# Patient Record
Sex: Male | Born: 1945 | Race: Black or African American | Hispanic: No | Marital: Single | Smoking: Former smoker
Health system: Southern US, Community
[De-identification: ages and names within clinical notes are randomized; demographics above are authoritative.]

## PROBLEM LIST (undated history)

## (undated) DIAGNOSIS — I10 Essential (primary) hypertension: Secondary | ICD-10-CM

## (undated) DIAGNOSIS — I509 Heart failure, unspecified: Secondary | ICD-10-CM

## (undated) DIAGNOSIS — F32A Depression, unspecified: Secondary | ICD-10-CM

## (undated) DIAGNOSIS — J439 Emphysema, unspecified: Secondary | ICD-10-CM

## (undated) DIAGNOSIS — J939 Pneumothorax, unspecified: Secondary | ICD-10-CM

## (undated) DIAGNOSIS — IMO0002 Reserved for concepts with insufficient information to code with codable children: Secondary | ICD-10-CM

## (undated) DIAGNOSIS — C3490 Malignant neoplasm of unspecified part of unspecified bronchus or lung: Secondary | ICD-10-CM

## (undated) DIAGNOSIS — M1990 Unspecified osteoarthritis, unspecified site: Secondary | ICD-10-CM

## (undated) DIAGNOSIS — I7090 Unspecified atherosclerosis: Secondary | ICD-10-CM

## (undated) DIAGNOSIS — IMO0001 Reserved for inherently not codable concepts without codable children: Secondary | ICD-10-CM

## (undated) DIAGNOSIS — F329 Major depressive disorder, single episode, unspecified: Secondary | ICD-10-CM

## (undated) DIAGNOSIS — E785 Hyperlipidemia, unspecified: Secondary | ICD-10-CM

## (undated) HISTORY — DX: Reserved for inherently not codable concepts without codable children: IMO0001

## (undated) HISTORY — PX: LUMBAR LAMINECTOMY: SHX95

## (undated) HISTORY — PX: EXCISIONAL HEMORRHOIDECTOMY: SHX1541

## (undated) HISTORY — PX: COLON SURGERY: SHX602

## (undated) HISTORY — DX: Reserved for concepts with insufficient information to code with codable children: IMO0002

---

## 2001-03-01 ENCOUNTER — Encounter: Payer: Self-pay | Admitting: *Deleted

## 2001-03-01 ENCOUNTER — Emergency Department (HOSPITAL_COMMUNITY): Admission: EM | Admit: 2001-03-01 | Discharge: 2001-03-01 | Payer: Self-pay | Admitting: *Deleted

## 2012-11-16 ENCOUNTER — Institutional Professional Consult (permissible substitution): Payer: Self-pay | Admitting: Internal Medicine

## 2013-02-13 LAB — PULMONARY FUNCTION TEST

## 2013-03-17 DIAGNOSIS — J939 Pneumothorax, unspecified: Secondary | ICD-10-CM

## 2013-03-17 HISTORY — DX: Pneumothorax, unspecified: J93.9

## 2013-04-01 DIAGNOSIS — C3490 Malignant neoplasm of unspecified part of unspecified bronchus or lung: Secondary | ICD-10-CM

## 2013-04-01 HISTORY — DX: Malignant neoplasm of unspecified part of unspecified bronchus or lung: C34.90

## 2013-04-01 HISTORY — PX: LUNG BIOPSY: SHX232

## 2013-04-15 ENCOUNTER — Telehealth: Payer: Self-pay

## 2013-04-15 NOTE — Telephone Encounter (Signed)
Received a call from [employer] systems officer) that patient had questions regarding appointment scheduled for Thursday 04/18/2013.Called patient and informed him that today he will be seen in consultation and to allow about 1.5 hours.I will review allergies, medications and health history and then he will have 1 hour with Dr.Wentworth to discuss radiation, side effects of  treatment and  planning and any further testing that may be required.Told he may have breakfast.No treatment scheduled for this appointment.Patient very appreciative of return call and explanation.

## 2013-04-18 ENCOUNTER — Ambulatory Visit: Payer: Medicare HMO

## 2013-04-18 ENCOUNTER — Ambulatory Visit: Payer: Self-pay | Admitting: Radiation Oncology

## 2013-04-18 ENCOUNTER — Encounter: Payer: Self-pay | Admitting: Radiation Oncology

## 2013-04-18 DIAGNOSIS — C3490 Malignant neoplasm of unspecified part of unspecified bronchus or lung: Secondary | ICD-10-CM | POA: Insufficient documentation

## 2013-04-18 NOTE — Progress Notes (Signed)
Thoracic Location of Tumor / Histology: RUL lung  Patient presented less than 1 month ago with symptoms of: RUL nodule of undetermined nature  Biopsies of RUL nodule (if applicable) revealed:  6/38/46 lung, RUL, biopsy: positive for adenocarcinoma  Tobacco/Marijuana/Snuff/ETOH use: history of smoking 1.5- 2 PPD x 45 years, quit in 2012,  no smokeless tobacco, no alcohol currently but history of daily beer, quit w/diagnosis  Past/Anticipated interventions by cardiothoracic surgery, if any: surgery unlikely to be recommended due to co morbidities, pulmonary function, pt's age  Past/Anticipated interventions by medical oncology, if any: seen by Dr Hinton Rao  Signs/Symptoms  Weight changes, if any: no  Respiratory complaints, if any: productive cough w/clear sputum, SOB w/exertion, uses O2 @ 1.5-2 L/min nightly and prn during day, nebulizer treatments daily, inhalers daily  Hemoptysis, if any: no  Pain issues, if any:  no  SAFETY ISSUES:  Prior radiation? no  Pacemaker/ICD? no  Possible current pregnancy? na  Is the patient on methotrexate? no  Current Complaints / other details:  Single, retired from [employer], never married, no children

## 2013-04-19 ENCOUNTER — Encounter: Payer: Self-pay | Admitting: Radiation Oncology

## 2013-04-19 ENCOUNTER — Ambulatory Visit
Admission: RE | Admit: 2013-04-19 | Discharge: 2013-04-19 | Disposition: A | Discharge status: Home / Self Care | Payer: Medicare HMO | Source: Ambulatory Visit | Attending: Radiation Oncology | Admitting: Radiation Oncology

## 2013-04-19 VITALS — BP 141/82 | HR 83 | Temp 98.7°F | Resp 20 | Ht 69.5 in | Wt 149.3 lb

## 2013-04-19 DIAGNOSIS — IMO0002 Reserved for concepts with insufficient information to code with codable children: Secondary | ICD-10-CM | POA: Insufficient documentation

## 2013-04-19 DIAGNOSIS — C3490 Malignant neoplasm of unspecified part of unspecified bronchus or lung: Secondary | ICD-10-CM

## 2013-04-19 DIAGNOSIS — Z7982 Long term (current) use of aspirin: Secondary | ICD-10-CM | POA: Insufficient documentation

## 2013-04-19 DIAGNOSIS — Z510 Encounter for antineoplastic radiation therapy: Secondary | ICD-10-CM | POA: Insufficient documentation

## 2013-04-19 DIAGNOSIS — Z79899 Other long term (current) drug therapy: Secondary | ICD-10-CM | POA: Insufficient documentation

## 2013-04-19 DIAGNOSIS — F3289 Other specified depressive episodes: Secondary | ICD-10-CM | POA: Insufficient documentation

## 2013-04-19 DIAGNOSIS — Z85118 Personal history of other malignant neoplasm of bronchus and lung: Secondary | ICD-10-CM | POA: Insufficient documentation

## 2013-04-19 DIAGNOSIS — F329 Major depressive disorder, single episode, unspecified: Secondary | ICD-10-CM | POA: Insufficient documentation

## 2013-04-19 DIAGNOSIS — C3410 Malignant neoplasm of upper lobe, unspecified bronchus or lung: Secondary | ICD-10-CM | POA: Insufficient documentation

## 2013-04-19 DIAGNOSIS — J438 Other emphysema: Secondary | ICD-10-CM | POA: Insufficient documentation

## 2013-04-19 DIAGNOSIS — I10 Essential (primary) hypertension: Secondary | ICD-10-CM | POA: Insufficient documentation

## 2013-04-19 DIAGNOSIS — Z87891 Personal history of nicotine dependence: Secondary | ICD-10-CM | POA: Insufficient documentation

## 2013-04-19 DIAGNOSIS — E785 Hyperlipidemia, unspecified: Secondary | ICD-10-CM | POA: Insufficient documentation

## 2013-04-19 HISTORY — DX: Unspecified osteoarthritis, unspecified site: M19.90

## 2013-04-19 HISTORY — DX: Heart failure, unspecified: I50.9

## 2013-04-19 HISTORY — DX: Pneumothorax, unspecified: J93.9

## 2013-04-19 HISTORY — DX: Emphysema, unspecified: J43.9

## 2013-04-19 HISTORY — DX: Depression, unspecified: F32.A

## 2013-04-19 HISTORY — DX: Unspecified atherosclerosis: I70.90

## 2013-04-19 HISTORY — DX: Malignant neoplasm of unspecified part of unspecified bronchus or lung: C34.90

## 2013-04-19 HISTORY — DX: Essential (primary) hypertension: I10

## 2013-04-19 HISTORY — DX: Hyperlipidemia, unspecified: E78.5

## 2013-04-19 HISTORY — DX: Major depressive disorder, single episode, unspecified: F32.9

## 2013-04-19 NOTE — Progress Notes (Signed)
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 male  who was found to have a right upper lobe mass on a chest x-ray. A followup CT scan showed a 2 cm mass in the right upper lobe. A PET scan was performed on 03/18/2013 which showed a 2.1 x 2.3 cm right upper lobe nodule with an SUV of 9.3. He underwent biopsy of this mass on 04/01/2013 which showed adenocarcinoma. His biopsy was complicated by a pneumothorax for which he was hospitalized overnight. He is recovered well from that. He was sent to me for my opinion regarding stereotactic body radiation in the management of this newly diagnosed stage I lung cancer. He has been evaluated by pulmonology and is not felt to be a surgical candidate due to his lung function. He is asymptomatic. He denies any weight loss hemoptysis headaches or bone pain. He presented unaccompanied today.Marland Kitchen  PREVIOUS RADIATION THERAPY: No  PAST MEDICAL HISTORY:  has a past medical history of Lung cancer (04/01/13); Emphysema lung; Atherosclerosis; Hypertension; Hyperlipidemia; Depression; Osteoarthritis; Pneumothorax (03/2013); and CHF (congestive heart failure).    PAST SURGICAL HISTORY: Past Surgical History  Procedure Laterality Date  . Lumbar laminectomy      many years ago  . Excisional hemorrhoidectomy    . Lung biopsy  04/01/13    RUL- adenocarcinoma    FAMILY HISTORY:  Family History  Problem Relation Age of Onset  . Heart disease Mother   . Hypertension Mother   . Diabetes Mother   . Stroke Father   . Heart disease Father   . Diabetes Father   . Cancer Brother     stomach  . Cancer Brother     liver    SOCIAL HISTORY:  History  Substance Use Topics  . Smoking  status: Former Smoker -- 1.00 packs/day for 45 years    Quit date: 04/20/2010  . Smokeless tobacco: Never Used  . Alcohol Use: No     Comment: hx of daily beer    ALLERGIES: Review of patient's allergies indicates no known allergies.  MEDICATIONS:  Current Outpatient Prescriptions  Medication Sig Dispense Refill  . albuterol (PROVENTIL HFA;VENTOLIN HFA) 108 (90 BASE) MCG/ACT inhaler Inhale into the lungs every 6 (six) hours as needed for wheezing or shortness of breath.      . ALPRAZolam (XANAX) 0.25 MG tablet Take 0.25 mg by mouth at bedtime as needed for anxiety.      Marland Kitchen amoxicillin (AMOXIL) 875 MG tablet Take 875 mg by mouth 3 (three) times daily.      Marland Kitchen aspirin 325 MG tablet Take 325 mg by mouth daily.      . budesonide-formoterol (SYMBICORT) 160-4.5 MCG/ACT inhaler Inhale 2 puffs into the lungs 2 (two) times daily.      Marland Kitchen Dextromethorphan-Guaifenesin (GUAIFENESIN DM PO) Take 400 mg by mouth.      . diltiazem (DILACOR XR) 240 MG 24 hr capsule Take 240 mg by mouth 2 (two) times daily.      . fluticasone (FLONASE) 50 MCG/ACT nasal spray Place into both nostrils 2 (two) times daily.      Marland Kitchen losartan-hydrochlorothiazide (HYZAAR) 50-12.5 MG per tablet Take 1 tablet by mouth daily.      Marland Kitchen  Multiple Vitamin (MULTIVITAMIN) tablet Take 1 tablet by mouth daily.      . predniSONE (DELTASONE) 10 MG tablet Take 10 mg by mouth daily with breakfast.      . roflumilast (DALIRESP) 500 MCG TABS tablet Take 500 mcg by mouth daily.       No current facility-administered medications for this encounter.    REVIEW OF SYSTEMS:  A 15 point review of systems is documented in the electronic medical record. This was obtained by the nursing staff. However, I reviewed this with the patient to discuss relevant findings and make appropriate changes.  A comprehensive review of systems was negative.  PHYSICAL EXAM:  Filed Vitals:   04/19/13 0911  BP: 141/82  Pulse: 83  Temp: 98.7 F (37.1 C)  Resp: 20  .149 lb  4.8 oz (67.722 kg). Pleasant male in no distress sitting comfortably on examining table. He has normal respiratory effort. He is alert and oriented x3.  LABORATORY DATA:  No results found for this basename: WBC, HGB, HCT, MCV, PLT   No results found for this basename: NA, K, CL, CO2   No results found for this basename: ALT, AST, GGT, ALKPHOS, BILITOT     RADIOGRAPHY: Outside PET scan images reviewed.    IMPRESSION: Stage I non-small cell lung cancer of the right upper lobe  PLAN: I discussed with the patient today his options for treatment. We discussed the process of simulation and the use of respiratory compression. We discussed the use of 4D CT. We discussed the excellent local control seen in patients who undergo SBRT. We discussed 5 treatments as an outpatient. We discussed arm pain and fatigue as possible side effects. We also discussed the possibility of mediastinal recurrence and the need for salvage surgery should he experience failure. He would like to proceed forward with simulation which I've scheduled next Thursday, April 9. I will also try to get a copy of his PFTs from his pulmonologist for our records. He has signed informed consent.  I spent 40 minutes  face to face with the patient and more than 50% of that time was spent in counseling and/or coordination of care.   ------------------------------------------------  Thea Silversmith, MD

## 2013-04-19 NOTE — Progress Notes (Signed)
Please see the Nurse Progress Note in the MD Initial Consult Encounter for this patient. 

## 2013-04-19 NOTE — Addendum Note (Signed)
Encounter addended by: Deirdre Evener, RN on: 04/19/2013  6:09 PM<BR>     Documentation filed: Charges VN

## 2013-04-25 ENCOUNTER — Encounter: Payer: Self-pay | Admitting: Radiation Oncology

## 2013-04-25 ENCOUNTER — Ambulatory Visit
Admission: RE | Admit: 2013-04-25 | Discharge: 2013-04-25 | Disposition: A | Discharge status: Home / Self Care | Payer: Medicare HMO | Source: Ambulatory Visit | Attending: Radiation Oncology | Admitting: Radiation Oncology

## 2013-04-25 DIAGNOSIS — C3410 Malignant neoplasm of upper lobe, unspecified bronchus or lung: Secondary | ICD-10-CM

## 2013-04-25 NOTE — Progress Notes (Signed)
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  Identity was confirmed.  All relevant records and images related to the planned course of therapy were reviewed.  Then, the patient was positioned in a stable reproducible clinical set-up for radiation therapy.  CT images were obtained.  Skin markings were placed.  A four dimensional simulation was then performed to track tumor movement throughout the patients' breathing cycle. The CT images were loaded into the planning software where the target and avoidance structures were contoured.  The GTV was outlined on the free breathing, 4D and MIP image sets.  The radiation prescription was entered and confirmed.   TREATMENT PLANNING NOTE:  Treatment planning then occurred. I have requested 3D simulation with [facility] of the spinal cord, total lungs and gross tumor volume. I have also requested mlcs and an isodose plan.   Special treatment procedure will be performed as [place] will be receiving high dose per fraction.

## 2013-04-25 NOTE — Progress Notes (Signed)
Met with patient today about financial concerns.  [employer] pays at 80/20 for radiation therapy.  I gave patient a cost estimate for treatment and explained that how billing process works.  He had some concerns about what balance would be after insurance pays.  I gave him a Medicaid application to fill out and return.  He also expressed he could pay a small monthly amount. I encouraged him to call me with any additional questions and told him I would talk with him again when his treatment was completed.

## 2013-05-01 ENCOUNTER — Encounter: Payer: Self-pay | Admitting: Specialist

## 2013-05-01 NOTE — Progress Notes (Signed)
Attempted to reach patient to offer support in response to moderate distress he reported on the Distress Screen. Was unable to reach him at either the home or work numbers at this time. Will try again.  Epifania Gore, [facility], PhD Chaplain

## 2013-05-07 ENCOUNTER — Ambulatory Visit
Admission: RE | Admit: 2013-05-07 | Discharge: 2013-05-07 | Disposition: A | Discharge status: Home / Self Care | Payer: Medicare HMO | Source: Ambulatory Visit | Attending: Radiation Oncology | Admitting: Radiation Oncology

## 2013-05-07 ENCOUNTER — Encounter: Payer: Self-pay | Admitting: Radiation Oncology

## 2013-05-07 VITALS — BP 133/80 | HR 97 | Temp 98.2°F | Ht 69.5 in | Wt 144.5 lb

## 2013-05-07 DIAGNOSIS — C3410 Malignant neoplasm of upper lobe, unspecified bronchus or lung: Secondary | ICD-10-CM

## 2013-05-07 NOTE — Progress Notes (Signed)
Andrew Hodges has received his first SBRT today.  He states he is wheezing from exposure to pollen, but no overt SOB/sheezing noted.  His O2 sat is 100% on room air.

## 2013-05-07 NOTE — Progress Notes (Signed)
  Radiation Oncology         [phone] ________________________________  Name: KOHL POLINSKY MRN: [id number]  Date: 05/07/2013  DOB: 29-Oct-1945  Stereotactic Body Radiotherapy Treatment Procedure Note (Fraction 1 of 5)  NARRATIVE:  Quint A McRAE JR was brought to the stereotactic radiation treatment machine and placed supine on the CT couch. The patient was set up for stereotactic body radiotherapy on the body fix pillow.  3D TREATMENT PLANNING AND DOSIMETRY:  The patient's radiation plan was reviewed and approved prior to starting treatment.  It showed 3-dimensional radiation distributions overlaid onto the planning CT.  The [facility] for the target structures as well as the organs at risk were reviewed. The documentation of this is filed in the radiation oncology EMR.  SIMULATION VERIFICATION:  The patient underwent CT imaging on the treatment unit.  These were carefully aligned to document that the ablative radiation dose would cover the target volume and maximally spare the nearby organs at risk according to the planned distribution.  SPECIAL TREATMENT PROCEDURE: Carnel A McRAE JR received high dose ablative stereotactic body radiotherapy to the planned target volume without unforeseen complications. Treatment was delivered uneventfully. The high doses associated with stereotactic body radiotherapy and the significant potential risks require careful treatment set up and patient monitoring constituting a special treatment procedure   STEREOTACTIC TREATMENT MANAGEMENT:  Following delivery, the patient was evaluated clinically. The patient tolerated treatment without significant acute effects, and was discharged to home in stable condition.    PLAN: Continue treatment as planned.  _________________________   Thea Silversmith, MD

## 2013-05-07 NOTE — Progress Notes (Signed)
  Weekly Management Note Current Dose: 12  Gy  Projected Dose: 60 Gy   Narrative:  The patient presents for routine under treatment assessment.  CBCT/MVCT images/Port film x-rays were reviewed.  The chart was checked. Doing well. No complaints except allergy symptoms.   Physical Findings: Weight:  . Unchanged. Red eyes.  Impression:  The patient is tolerating radiation.  Plan:  Continue treatment as planned.

## 2013-05-09 ENCOUNTER — Ambulatory Visit
Admission: RE | Admit: 2013-05-09 | Discharge: 2013-05-09 | Disposition: A | Discharge status: Home / Self Care | Payer: Medicare HMO | Source: Ambulatory Visit | Attending: Radiation Oncology | Admitting: Radiation Oncology

## 2013-05-09 DIAGNOSIS — C3490 Malignant neoplasm of unspecified part of unspecified bronchus or lung: Secondary | ICD-10-CM

## 2013-05-09 NOTE — Progress Notes (Signed)
  Radiation Oncology         (336) [phone] ________________________________  Name: ASKARI KINLEY MRN: [id number]  Date: 05/09/2013  DOB: November 04, 1945  Stereotactic Body Radiotherapy Treatment Procedure Note  NARRATIVE:  Manson A McRAE JR was brought to the stereotactic radiation treatment machine and placed supine on the CT couch. The patient was set up for stereotactic body radiotherapy on the body fix pillow.  3D TREATMENT PLANNING AND DOSIMETRY:  The patient's radiation plan was reviewed and approved prior to starting treatment.  It showed 3-dimensional radiation distributions overlaid onto the planning CT.  The [facility] for the target structures as well as the organs at risk were reviewed. The documentation of this is filed in the radiation oncology EMR.  SIMULATION VERIFICATION:  The patient underwent CT imaging on the treatment unit.  These were carefully aligned to document that the ablative radiation dose would cover the target volume and maximally spare the nearby organs at risk according to the planned distribution.  SPECIAL TREATMENT PROCEDURE: Hooper A McRAE JR received high dose ablative stereotactic body radiotherapy to the planned target volume without unforeseen complications. Treatment was delivered uneventfully. The high doses associated with stereotactic body radiotherapy and the significant potential risks require careful treatment set up and patient monitoring constituting a special treatment procedure   STEREOTACTIC TREATMENT MANAGEMENT:  Following delivery, the patient was evaluated clinically. The patient tolerated treatment without significant acute effects, and was discharged to home in stable condition.    PLAN: Continue treatment as planned.  _________________________   Thea Silversmith, MD

## 2013-05-13 ENCOUNTER — Ambulatory Visit
Admission: RE | Admit: 2013-05-13 | Discharge: 2013-05-13 | Disposition: A | Discharge status: Home / Self Care | Payer: Medicare HMO | Source: Ambulatory Visit | Attending: Radiation Oncology | Admitting: Radiation Oncology

## 2013-05-13 ENCOUNTER — Encounter: Payer: Self-pay | Admitting: Radiation Oncology

## 2013-05-13 NOTE — Progress Notes (Signed)
  Radiation Oncology         [phone]) [phone] ________________________________  Name: Andrew Hodges MRN: [id number]  Date: 05/13/2013  DOB: 1945-11-23  Stereotactic Body Radiotherapy Treatment Procedure Note  NARRATIVE:  Andrew Hodges was brought to the stereotactic radiation treatment machine and placed supine on the CT couch. The patient was set up for stereotactic body radiotherapy on the body fix pillow.  3D TREATMENT PLANNING AND DOSIMETRY:  The patient's radiation plan was reviewed and approved prior to starting treatment.  It showed 3-dimensional radiation distributions overlaid onto the planning CT.  The [facility] for the target structures as well as the organs at risk were reviewed. The documentation of this is filed in the radiation oncology EMR.  SIMULATION VERIFICATION:  The patient underwent CT imaging on the treatment unit.  These were carefully aligned to document that the ablative radiation dose would cover the right lung target volume and maximally spare the nearby organs at risk according to the planned distribution.  SPECIAL TREATMENT PROCEDURE: Andrew Hodges received high dose ablative stereotactic body radiotherapy to the planned target volume without unforeseen complications. Treatment was delivered uneventfully. Andrew Hodges received an additional 1200 cGy for a cumulative dose of 3600 cGy in 3 sessions of a prescribed 6000 cGy in 5 sessions. The high doses associated with stereotactic body radiotherapy and the significant potential risks require careful treatment set up and patient monitoring constituting a special treatment procedure   STEREOTACTIC TREATMENT MANAGEMENT:  Following delivery, the patient was evaluated clinically. The patient tolerated treatment without significant acute effects, and was discharged to home in stable condition.    PLAN: Continue treatment as planned.  ------------------------------------------------        Rexene Edison, MD

## 2013-05-15 ENCOUNTER — Ambulatory Visit
Admission: RE | Admit: 2013-05-15 | Discharge: 2013-05-15 | Disposition: A | Discharge status: Home / Self Care | Payer: Medicare HMO | Source: Ambulatory Visit | Attending: Radiation Oncology | Admitting: Radiation Oncology

## 2013-05-15 DIAGNOSIS — C3410 Malignant neoplasm of upper lobe, unspecified bronchus or lung: Secondary | ICD-10-CM

## 2013-05-15 NOTE — Progress Notes (Signed)
  Radiation Oncology         [phone] ________________________________  Name: Andrew Hodges MRN: [id number]  Date: 05/15/2013  DOB: November 07, 1945  Stereotactic Body Radiotherapy Treatment Procedure Note  NARRATIVE:  Andrew Hodges was brought to the stereotactic radiation treatment machine and placed supine on the CT couch. The patient was set up for stereotactic body radiotherapy on the body fix pillow.  3D TREATMENT PLANNING AND DOSIMETRY:  The patient's radiation plan was reviewed and approved prior to starting treatment.  It showed 3-dimensional radiation distributions overlaid onto the planning CT.  The [facility] for the target structures as well as the organs at risk were reviewed. The documentation of this is filed in the radiation oncology EMR.  SIMULATION VERIFICATION:  The patient underwent CT imaging on the treatment unit.  These were carefully aligned to document that the ablative radiation dose would cover the target volume and maximally spare the nearby organs at risk according to the planned distribution.  SPECIAL TREATMENT PROCEDURE: Andrew Hodges received high dose ablative stereotactic body radiotherapy to the planned target volume without unforeseen complications. Treatment was delivered uneventfully. The high doses associated with stereotactic body radiotherapy and the significant potential risks require careful treatment set up and patient monitoring constituting a special treatment procedure   STEREOTACTIC TREATMENT MANAGEMENT:  Following delivery, the patient was evaluated clinically. The patient tolerated treatment without significant acute effects, and was discharged to home in stable condition.    PLAN: Continue treatment as planned.  _________________________   Thea Silversmith, MD

## 2013-05-17 ENCOUNTER — Ambulatory Visit
Admission: RE | Admit: 2013-05-17 | Discharge: 2013-05-17 | Disposition: A | Discharge status: Home / Self Care | Payer: Medicare HMO | Source: Ambulatory Visit | Attending: Radiation Oncology | Admitting: Radiation Oncology

## 2013-05-17 ENCOUNTER — Encounter: Payer: Self-pay | Admitting: Radiation Oncology

## 2013-05-17 NOTE — Progress Notes (Signed)
  Radiation Oncology         [phone] ________________________________  Name: KRISTIAN MOGG MRN: [id number]  Date: 05/17/2013  DOB: 03-Jun-1945  Stereotactic Body Radiotherapy Treatment Procedure Note Right lung  NARRATIVE:  Quin A McRAE JR was brought to the stereotactic radiation treatment machine and placed supine on the CT couch. The patient was set up for stereotactic body radiotherapy on the body fix pillow.  3D TREATMENT PLANNING AND DOSIMETRY:  The patient's radiation plan was reviewed and approved prior to starting treatment.  It showed 3-dimensional radiation distributions overlaid onto the planning CT.  The [facility] for the target structures as well as the organs at risk were reviewed. The documentation of this is filed in the radiation oncology EMR.  SIMULATION VERIFICATION:  The patient underwent CT imaging on the treatment unit.  These were carefully aligned to document that the ablative radiation dose would cover the target volume and maximally spare the nearby organs at risk according to the planned distribution.  SPECIAL TREATMENT PROCEDURE: Creedence A McRAE JR received high dose ablative stereotactic body radiotherapy to the planned target volume without unforeseen complications. Treatment was delivered uneventfully. The high doses associated with stereotactic body radiotherapy and the significant potential risks require careful treatment set up and patient monitoring constituting a special treatment procedure   STEREOTACTIC TREATMENT MANAGEMENT:  Following delivery, the patient was evaluated clinically. The patient tolerated treatment without significant acute effects, and was discharged to home in stable condition.    PLAN: f/u per Dr. Pablo Ledger.  ________________________________  Eppie Gibson, MD

## 2013-05-17 NOTE — Progress Notes (Signed)
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   He had no ill effects from treatment.   Plan: The patient has completed radiation treatment. The patient will return to radiation oncology clinic for routine followup in 6 weeks after a CT of the chest in [place].  He will also be set up for follow up with Dr. Hinton Rao at his request. I advised him to call or return sooner if they have any questions or concerns related to their recovery or treatment.  ------------------------------------------------  Thea Silversmith, MD

## 2013-05-22 ENCOUNTER — Telehealth: Payer: Self-pay | Admitting: *Deleted

## 2013-05-22 ENCOUNTER — Other Ambulatory Visit: Payer: Self-pay | Admitting: Radiation Oncology

## 2013-05-22 DIAGNOSIS — C3490 Malignant neoplasm of unspecified part of unspecified bronchus or lung: Secondary | ICD-10-CM

## 2013-05-22 NOTE — Telephone Encounter (Signed)
Called patient to inform of lab, test and fu with Dr. Pablo Ledger and Dr. Hinton Rao, no answer line busy will call later.

## 2013-05-22 NOTE — Telephone Encounter (Signed)
CALLED PATIENT TO INFORM OF LAB @ 10:00 AM ON 07-03-13 AND HIS CT ON 07-03-13- ARRIVAL TIME - 10:45 AM @ RADIOLOGY @ [place] @ [place] FU VISIT WILL BE ON 07-04-13 @ 2 PM WITH DR. MCCARTY AND HIS FU VISIT WILL BE ON 07-05-13 @ 3:00 PM WITH DR. Pablo Ledger, LVM FOR A RETURN CALL

## 2013-07-05 ENCOUNTER — Ambulatory Visit
Admission: RE | Admit: 2013-07-05 | Discharge: 2013-07-05 | Disposition: A | Discharge status: Home / Self Care | Payer: Medicare HMO | Source: Ambulatory Visit | Attending: Radiation Oncology | Admitting: Radiation Oncology

## 2013-07-05 VITALS — BP 100/61 | HR 83 | Temp 98.6°F | Resp 18 | Wt 145.8 lb

## 2013-07-05 DIAGNOSIS — C3410 Malignant neoplasm of upper lobe, unspecified bronchus or lung: Secondary | ICD-10-CM

## 2013-07-05 NOTE — Progress Notes (Signed)
Patient for routine 6 week follow up completion of radiation to right upper lobe (NSCL) total of 5 treatments last on 05/17/2013.Ct of chest completed on 07/03/13 in  [place]  reveals 1.no metastatic disease,2.emphysemaand atherosclerosis and 3. Right upper lobe nodule minimally smaller.Denies pain.Has shortness of breath at times greater on exertion.Continued cough at all times of clear to tan secretions.states he tolerated radiation well without any skin changes but increased fatigue.

## 2013-07-05 NOTE — Progress Notes (Signed)
   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  He is feeling well except for a productive cough. He saw his PCP last week who prescribed antibiotics which he finished a few days ago. He hasn't noticed any change. He has no fever and his breathing is stable. He saw Dr. Hinton Rao yesterday and got the results of his scan.   Allergies: No Known Allergies  Medications:  Current Outpatient Prescriptions  Medication Sig Dispense Refill  . albuterol (PROVENTIL HFA;VENTOLIN HFA) 108 (90 BASE) MCG/ACT inhaler Inhale into the lungs every 6 (six) hours as needed for wheezing or shortness of breath.      . ALPRAZolam (XANAX) 0.25 MG tablet Take 0.25 mg by mouth at bedtime as needed for anxiety.      Marland Kitchen aspirin 325 MG tablet Take 325 mg by mouth daily.      . budesonide-formoterol (SYMBICORT) 160-4.5 MCG/ACT inhaler Inhale 2 puffs into the lungs 2 (two) times daily.      Marland Kitchen diltiazem (DILACOR XR) 240 MG 24 hr capsule Take 240 mg by mouth 2 (two) times daily.      . fluticasone (FLONASE) 50 MCG/ACT nasal spray Place into both nostrils 2 (two) times daily.      Marland Kitchen losartan-hydrochlorothiazide (HYZAAR) 50-12.5 MG per tablet Take 1 tablet by mouth daily.      . Multiple Vitamin (MULTIVITAMIN) tablet Take 1 tablet by mouth daily.      Marland Kitchen oxyCODONE (OXY IR/ROXICODONE) 5 MG immediate release tablet       . roflumilast (DALIRESP) 500 MCG TABS tablet Take 500 mcg by mouth daily.       No current facility-administered medications for this encounter.    Physical Exam:  Filed Vitals:   07/05/13 1529  BP: 100/61  Pulse: 83  Temp: 98.6 F (37 C)  Resp: 18    Weight: 145 lb 12.8 oz (66.134 kg)  SpO2: 95%  Appears well. No distress.   Imaging: CT chest on 06/26/13 at [facility]: Andrew Hodges is a 68 y.o. male s/p SBRT with good response to treatment.   PLAN:  Patient desires to be followed at [facility]. We discussed NCCN recommendations for follow up in 6 months with scans every 6-12 months. We discussed that there may be some scar tissue that develops at this site and that scar tissue can enlarge even up to a year after treatment. He knows he can call me with any questions. He will follow up with his PCP  On Monday re: his cough.     Thea Silversmith, MD

## 2013-11-15 ENCOUNTER — Telehealth: Payer: Self-pay | Admitting: *Deleted

## 2013-11-15 NOTE — Telephone Encounter (Signed)
On 11-15-13 fax medical records to [facility] it was consult note, end of tx , sim & planning note, follow up note.

## 2015-11-27 DIAGNOSIS — I482 Chronic atrial fibrillation: Secondary | ICD-10-CM

## 2015-11-27 DIAGNOSIS — J159 Unspecified bacterial pneumonia: Secondary | ICD-10-CM

## 2015-11-27 DIAGNOSIS — J9610 Chronic respiratory failure, unspecified whether with hypoxia or hypercapnia: Secondary | ICD-10-CM

## 2015-11-27 DIAGNOSIS — J441 Chronic obstructive pulmonary disease with (acute) exacerbation: Secondary | ICD-10-CM

## 2015-11-27 DIAGNOSIS — K219 Gastro-esophageal reflux disease without esophagitis: Secondary | ICD-10-CM

## 2015-11-27 DIAGNOSIS — I10 Essential (primary) hypertension: Secondary | ICD-10-CM

## 2015-11-27 DIAGNOSIS — Z789 Other specified health status: Secondary | ICD-10-CM

## 2015-11-27 DIAGNOSIS — E7800 Pure hypercholesterolemia, unspecified: Secondary | ICD-10-CM

## 2015-11-27 DIAGNOSIS — B0050 Herpesviral ocular disease, unspecified: Secondary | ICD-10-CM

## 2015-11-27 DIAGNOSIS — N183 Chronic kidney disease, stage 3 (moderate): Secondary | ICD-10-CM

## 2015-11-28 DIAGNOSIS — J441 Chronic obstructive pulmonary disease with (acute) exacerbation: Secondary | ICD-10-CM | POA: Diagnosis not present

## 2015-11-28 DIAGNOSIS — J159 Unspecified bacterial pneumonia: Secondary | ICD-10-CM | POA: Diagnosis not present

## 2015-11-28 DIAGNOSIS — J9610 Chronic respiratory failure, unspecified whether with hypoxia or hypercapnia: Secondary | ICD-10-CM | POA: Diagnosis not present

## 2015-11-28 DIAGNOSIS — Z789 Other specified health status: Secondary | ICD-10-CM | POA: Diagnosis not present

## 2015-11-29 DIAGNOSIS — J159 Unspecified bacterial pneumonia: Secondary | ICD-10-CM | POA: Diagnosis not present

## 2015-11-29 DIAGNOSIS — I482 Chronic atrial fibrillation: Secondary | ICD-10-CM | POA: Diagnosis not present

## 2015-11-29 DIAGNOSIS — J9610 Chronic respiratory failure, unspecified whether with hypoxia or hypercapnia: Secondary | ICD-10-CM | POA: Diagnosis not present

## 2015-11-29 DIAGNOSIS — Z789 Other specified health status: Secondary | ICD-10-CM | POA: Diagnosis not present

## 2015-11-30 DIAGNOSIS — J9610 Chronic respiratory failure, unspecified whether with hypoxia or hypercapnia: Secondary | ICD-10-CM | POA: Diagnosis not present

## 2015-11-30 DIAGNOSIS — J159 Unspecified bacterial pneumonia: Secondary | ICD-10-CM | POA: Diagnosis not present

## 2015-11-30 DIAGNOSIS — Z789 Other specified health status: Secondary | ICD-10-CM | POA: Diagnosis not present

## 2015-11-30 DIAGNOSIS — I482 Chronic atrial fibrillation: Secondary | ICD-10-CM | POA: Diagnosis not present

## 2015-12-01 DIAGNOSIS — J159 Unspecified bacterial pneumonia: Secondary | ICD-10-CM | POA: Diagnosis not present

## 2015-12-01 DIAGNOSIS — J9610 Chronic respiratory failure, unspecified whether with hypoxia or hypercapnia: Secondary | ICD-10-CM | POA: Diagnosis not present

## 2015-12-01 DIAGNOSIS — Z789 Other specified health status: Secondary | ICD-10-CM | POA: Diagnosis not present

## 2015-12-01 DIAGNOSIS — I482 Chronic atrial fibrillation: Secondary | ICD-10-CM | POA: Diagnosis not present

## 2020-08-06 DIAGNOSIS — N183 Chronic kidney disease, stage 3 unspecified: Secondary | ICD-10-CM | POA: Diagnosis not present

## 2020-08-06 DIAGNOSIS — J449 Chronic obstructive pulmonary disease, unspecified: Secondary | ICD-10-CM | POA: Diagnosis not present

## 2020-08-06 DIAGNOSIS — J9621 Acute and chronic respiratory failure with hypoxia: Secondary | ICD-10-CM | POA: Diagnosis not present

## 2020-08-06 DIAGNOSIS — I2510 Atherosclerotic heart disease of native coronary artery without angina pectoris: Secondary | ICD-10-CM | POA: Diagnosis not present

## 2020-08-07 DIAGNOSIS — N183 Chronic kidney disease, stage 3 unspecified: Secondary | ICD-10-CM | POA: Diagnosis not present

## 2020-08-07 DIAGNOSIS — J9621 Acute and chronic respiratory failure with hypoxia: Secondary | ICD-10-CM | POA: Diagnosis not present

## 2020-08-07 DIAGNOSIS — I2510 Atherosclerotic heart disease of native coronary artery without angina pectoris: Secondary | ICD-10-CM | POA: Diagnosis not present

## 2020-08-07 DIAGNOSIS — J449 Chronic obstructive pulmonary disease, unspecified: Secondary | ICD-10-CM | POA: Diagnosis not present

## 2020-08-08 DIAGNOSIS — N183 Chronic kidney disease, stage 3 unspecified: Secondary | ICD-10-CM | POA: Diagnosis not present

## 2020-08-08 DIAGNOSIS — I2510 Atherosclerotic heart disease of native coronary artery without angina pectoris: Secondary | ICD-10-CM | POA: Diagnosis not present

## 2020-08-08 DIAGNOSIS — J449 Chronic obstructive pulmonary disease, unspecified: Secondary | ICD-10-CM | POA: Diagnosis not present

## 2020-08-08 DIAGNOSIS — J9621 Acute and chronic respiratory failure with hypoxia: Secondary | ICD-10-CM | POA: Diagnosis not present

## 2020-08-09 ENCOUNTER — Other Ambulatory Visit
Admission: RE | Admit: 2020-08-09 | Discharge: 2020-08-09 | Disposition: A | Discharge status: Home / Self Care | Payer: Medicare HMO | Source: Ambulatory Visit | Attending: Internal Medicine | Admitting: Internal Medicine

## 2020-08-09 ENCOUNTER — Other Ambulatory Visit
Admission: RE | Admit: 2020-08-09 | Discharge: 2020-08-09 | Disposition: A | Discharge status: Home / Self Care | Payer: Medicare HMO | Source: Ambulatory Visit | Attending: *Deleted | Admitting: *Deleted

## 2020-08-09 DIAGNOSIS — J449 Chronic obstructive pulmonary disease, unspecified: Secondary | ICD-10-CM | POA: Diagnosis not present

## 2020-08-09 DIAGNOSIS — J9621 Acute and chronic respiratory failure with hypoxia: Secondary | ICD-10-CM | POA: Diagnosis not present

## 2020-08-09 DIAGNOSIS — N183 Chronic kidney disease, stage 3 unspecified: Secondary | ICD-10-CM | POA: Diagnosis not present

## 2020-08-09 DIAGNOSIS — I2510 Atherosclerotic heart disease of native coronary artery without angina pectoris: Secondary | ICD-10-CM | POA: Diagnosis not present

## 2020-08-10 ENCOUNTER — Inpatient Hospital Stay (HOSPITAL_COMMUNITY)
Admission: EM | Admit: 2020-08-10 | Discharge: 2020-08-17 | DRG: 208 | Disposition: E | Payer: Medicare HMO | Attending: Pulmonary Disease | Admitting: Pulmonary Disease

## 2020-08-10 ENCOUNTER — Emergency Department (HOSPITAL_COMMUNITY): Payer: Medicare HMO

## 2020-08-10 ENCOUNTER — Other Ambulatory Visit: Payer: Self-pay

## 2020-08-10 DIAGNOSIS — N183 Chronic kidney disease, stage 3 unspecified: Secondary | ICD-10-CM

## 2020-08-10 DIAGNOSIS — Y95 Nosocomial condition: Secondary | ICD-10-CM | POA: Diagnosis present

## 2020-08-10 DIAGNOSIS — J449 Chronic obstructive pulmonary disease, unspecified: Secondary | ICD-10-CM

## 2020-08-10 DIAGNOSIS — E872 Acidosis: Secondary | ICD-10-CM | POA: Diagnosis present

## 2020-08-10 DIAGNOSIS — N1831 Chronic kidney disease, stage 3a: Secondary | ICD-10-CM | POA: Diagnosis present

## 2020-08-10 DIAGNOSIS — J189 Pneumonia, unspecified organism: Secondary | ICD-10-CM | POA: Diagnosis present

## 2020-08-10 DIAGNOSIS — J9602 Acute respiratory failure with hypercapnia: Secondary | ICD-10-CM

## 2020-08-10 DIAGNOSIS — J439 Emphysema, unspecified: Secondary | ICD-10-CM | POA: Diagnosis present

## 2020-08-10 DIAGNOSIS — Z86718 Personal history of other venous thrombosis and embolism: Secondary | ICD-10-CM

## 2020-08-10 DIAGNOSIS — I248 Other forms of acute ischemic heart disease: Secondary | ICD-10-CM | POA: Diagnosis present

## 2020-08-10 DIAGNOSIS — F419 Anxiety disorder, unspecified: Secondary | ICD-10-CM | POA: Diagnosis present

## 2020-08-10 DIAGNOSIS — Z7401 Bed confinement status: Secondary | ICD-10-CM

## 2020-08-10 DIAGNOSIS — E870 Hyperosmolality and hypernatremia: Secondary | ICD-10-CM | POA: Diagnosis present

## 2020-08-10 DIAGNOSIS — J180 Bronchopneumonia, unspecified organism: Principal | ICD-10-CM | POA: Diagnosis present

## 2020-08-10 DIAGNOSIS — I130 Hypertensive heart and chronic kidney disease with heart failure and stage 1 through stage 4 chronic kidney disease, or unspecified chronic kidney disease: Secondary | ICD-10-CM | POA: Diagnosis present

## 2020-08-10 DIAGNOSIS — R64 Cachexia: Secondary | ICD-10-CM | POA: Diagnosis present

## 2020-08-10 DIAGNOSIS — G8929 Other chronic pain: Secondary | ICD-10-CM | POA: Diagnosis present

## 2020-08-10 DIAGNOSIS — G4730 Sleep apnea, unspecified: Secondary | ICD-10-CM | POA: Diagnosis present

## 2020-08-10 DIAGNOSIS — E785 Hyperlipidemia, unspecified: Secondary | ICD-10-CM | POA: Diagnosis present

## 2020-08-10 DIAGNOSIS — M109 Gout, unspecified: Secondary | ICD-10-CM | POA: Diagnosis present

## 2020-08-10 DIAGNOSIS — I480 Paroxysmal atrial fibrillation: Secondary | ICD-10-CM | POA: Diagnosis present

## 2020-08-10 DIAGNOSIS — I10 Essential (primary) hypertension: Secondary | ICD-10-CM | POA: Diagnosis present

## 2020-08-10 DIAGNOSIS — Z9119 Patient's noncompliance with other medical treatment and regimen: Secondary | ICD-10-CM

## 2020-08-10 DIAGNOSIS — R079 Chest pain, unspecified: Secondary | ICD-10-CM | POA: Diagnosis not present

## 2020-08-10 DIAGNOSIS — R7989 Other specified abnormal findings of blood chemistry: Secondary | ICD-10-CM | POA: Diagnosis present

## 2020-08-10 DIAGNOSIS — N179 Acute kidney failure, unspecified: Secondary | ICD-10-CM

## 2020-08-10 DIAGNOSIS — F32A Depression, unspecified: Secondary | ICD-10-CM | POA: Diagnosis present

## 2020-08-10 DIAGNOSIS — I5032 Chronic diastolic (congestive) heart failure: Secondary | ICD-10-CM | POA: Diagnosis present

## 2020-08-10 DIAGNOSIS — J9621 Acute and chronic respiratory failure with hypoxia: Secondary | ICD-10-CM | POA: Diagnosis present

## 2020-08-10 DIAGNOSIS — Z9981 Dependence on supplemental oxygen: Secondary | ICD-10-CM | POA: Diagnosis not present

## 2020-08-10 DIAGNOSIS — R5381 Other malaise: Secondary | ICD-10-CM | POA: Diagnosis not present

## 2020-08-10 DIAGNOSIS — I2510 Atherosclerotic heart disease of native coronary artery without angina pectoris: Secondary | ICD-10-CM | POA: Diagnosis present

## 2020-08-10 DIAGNOSIS — E875 Hyperkalemia: Secondary | ICD-10-CM | POA: Diagnosis present

## 2020-08-10 DIAGNOSIS — Z20822 Contact with and (suspected) exposure to covid-19: Secondary | ICD-10-CM | POA: Diagnosis present

## 2020-08-10 DIAGNOSIS — J9622 Acute and chronic respiratory failure with hypercapnia: Secondary | ICD-10-CM | POA: Diagnosis present

## 2020-08-10 DIAGNOSIS — Z85118 Personal history of other malignant neoplasm of bronchus and lung: Secondary | ICD-10-CM | POA: Diagnosis not present

## 2020-08-10 DIAGNOSIS — Z79891 Long term (current) use of opiate analgesic: Secondary | ICD-10-CM

## 2020-08-10 DIAGNOSIS — R532 Functional quadriplegia: Secondary | ICD-10-CM | POA: Diagnosis present

## 2020-08-10 DIAGNOSIS — D696 Thrombocytopenia, unspecified: Secondary | ICD-10-CM | POA: Diagnosis present

## 2020-08-10 DIAGNOSIS — R0600 Dyspnea, unspecified: Secondary | ICD-10-CM

## 2020-08-10 DIAGNOSIS — Z809 Family history of malignant neoplasm, unspecified: Secondary | ICD-10-CM

## 2020-08-10 DIAGNOSIS — Z79899 Other long term (current) drug therapy: Secondary | ICD-10-CM

## 2020-08-10 DIAGNOSIS — J9620 Acute and chronic respiratory failure, unspecified whether with hypoxia or hypercapnia: Secondary | ICD-10-CM

## 2020-08-10 DIAGNOSIS — I2583 Coronary atherosclerosis due to lipid rich plaque: Secondary | ICD-10-CM | POA: Diagnosis not present

## 2020-08-10 DIAGNOSIS — Z833 Family history of diabetes mellitus: Secondary | ICD-10-CM

## 2020-08-10 DIAGNOSIS — E7800 Pure hypercholesterolemia, unspecified: Secondary | ICD-10-CM | POA: Diagnosis not present

## 2020-08-10 DIAGNOSIS — I700 Atherosclerosis of aorta: Secondary | ICD-10-CM | POA: Diagnosis present

## 2020-08-10 DIAGNOSIS — Z8249 Family history of ischemic heart disease and other diseases of the circulatory system: Secondary | ICD-10-CM

## 2020-08-10 DIAGNOSIS — R778 Other specified abnormalities of plasma proteins: Secondary | ICD-10-CM | POA: Diagnosis not present

## 2020-08-10 DIAGNOSIS — Z9049 Acquired absence of other specified parts of digestive tract: Secondary | ICD-10-CM

## 2020-08-10 DIAGNOSIS — Z85038 Personal history of other malignant neoplasm of large intestine: Secondary | ICD-10-CM

## 2020-08-10 DIAGNOSIS — Z87891 Personal history of nicotine dependence: Secondary | ICD-10-CM

## 2020-08-10 DIAGNOSIS — Z923 Personal history of irradiation: Secondary | ICD-10-CM

## 2020-08-10 DIAGNOSIS — Z7901 Long term (current) use of anticoagulants: Secondary | ICD-10-CM

## 2020-08-10 DIAGNOSIS — J441 Chronic obstructive pulmonary disease with (acute) exacerbation: Secondary | ICD-10-CM

## 2020-08-10 DIAGNOSIS — J9601 Acute respiratory failure with hypoxia: Secondary | ICD-10-CM

## 2020-08-10 DIAGNOSIS — G4733 Obstructive sleep apnea (adult) (pediatric): Secondary | ICD-10-CM | POA: Diagnosis present

## 2020-08-10 DIAGNOSIS — I469 Cardiac arrest, cause unspecified: Secondary | ICD-10-CM

## 2020-08-10 DIAGNOSIS — Z823 Family history of stroke: Secondary | ICD-10-CM

## 2020-08-10 DIAGNOSIS — Z7951 Long term (current) use of inhaled steroids: Secondary | ICD-10-CM

## 2020-08-10 LAB — I-STAT VENOUS BLOOD GAS, ED
Acid-Base Excess: 12 mmol/L — ABNORMAL HIGH (ref 0.0–2.0)
Bicarbonate: 40.4 mmol/L — ABNORMAL HIGH (ref 20.0–28.0)
Calcium, Ion: 1.17 mmol/L (ref 1.15–1.40)
HCT: 45 % (ref 39.0–52.0)
Hemoglobin: 15.3 g/dL (ref 13.0–17.0)
O2 Saturation: 77 %
Potassium: 5.4 mmol/L — ABNORMAL HIGH (ref 3.5–5.1)
Sodium: 146 mmol/L — ABNORMAL HIGH (ref 135–145)
TCO2: 42 mmol/L — ABNORMAL HIGH (ref 22–32)
pCO2, Ven: 69.8 mmHg — ABNORMAL HIGH (ref 44.0–60.0)
pH, Ven: 7.37 (ref 7.250–7.430)
pO2, Ven: 45 mmHg (ref 32.0–45.0)

## 2020-08-10 LAB — COMPREHENSIVE METABOLIC PANEL
ALT: 33 U/L (ref 0–44)
AST: 29 U/L (ref 15–41)
Albumin: 3.1 g/dL — ABNORMAL LOW (ref 3.5–5.0)
Alkaline Phosphatase: 45 U/L (ref 38–126)
Anion gap: 9 (ref 5–15)
BUN: 73 mg/dL — ABNORMAL HIGH (ref 8–23)
CO2: 33 mmol/L — ABNORMAL HIGH (ref 22–32)
Calcium: 9.2 mg/dL (ref 8.9–10.3)
Chloride: 106 mmol/L (ref 98–111)
Creatinine, Ser: 1.72 mg/dL — ABNORMAL HIGH (ref 0.61–1.24)
GFR, Estimated: 42 mL/min — ABNORMAL LOW (ref >60)
Glucose, Bld: 124 mg/dL — ABNORMAL HIGH (ref 70–99)
Potassium: 5.7 mmol/L — ABNORMAL HIGH (ref 3.5–5.1)
Sodium: 148 mmol/L — ABNORMAL HIGH (ref 135–145)
Total Bilirubin: 0.6 mg/dL (ref 0.3–1.2)
Total Protein: 5.7 g/dL — ABNORMAL LOW (ref 6.5–8.1)

## 2020-08-10 LAB — CBC
HCT: 44.3 % (ref 39.0–52.0)
Hemoglobin: 13.9 g/dL (ref 13.0–17.0)
MCH: 29.4 pg (ref 26.0–34.0)
MCHC: 31.4 g/dL (ref 30.0–36.0)
MCV: 93.9 fL (ref 80.0–100.0)
Platelets: 98 10*3/uL — ABNORMAL LOW (ref 150–400)
RBC: 4.72 MIL/uL (ref 4.22–5.81)
RDW: 15.8 % — ABNORMAL HIGH (ref 11.5–15.5)
WBC: 14.8 10*3/uL — ABNORMAL HIGH (ref 4.0–10.5)
nRBC: 0 % (ref 0.0–0.2)

## 2020-08-10 LAB — CBC WITH DIFFERENTIAL/PLATELET
Abs Immature Granulocytes: 0.09 10*3/uL — ABNORMAL HIGH (ref 0.00–0.07)
Abs Immature Granulocytes: 0.07 10*3/uL (ref 0.00–0.07)
Basophils Absolute: 0 10*3/uL (ref 0.0–0.1)
Basophils Absolute: 0 10*3/uL (ref 0.0–0.1)
Basophils Relative: 0 %
Basophils Relative: 0 %
Eosinophils Absolute: 0 10*3/uL (ref 0.0–0.5)
Eosinophils Absolute: 0 10*3/uL (ref 0.0–0.5)
Eosinophils Relative: 0 %
Eosinophils Relative: 0 %
HCT: 44.8 % (ref 39.0–52.0)
HCT: 47.1 % (ref 39.0–52.0)
Hemoglobin: 14.1 g/dL (ref 13.0–17.0)
Hemoglobin: 14.3 g/dL (ref 13.0–17.0)
Immature Granulocytes: 1 %
Immature Granulocytes: 1 %
Lymphocytes Relative: 3 %
Lymphocytes Relative: 3 %
Lymphs Abs: 0.5 10*3/uL — ABNORMAL LOW (ref 0.7–4.0)
Lymphs Abs: 0.4 10*3/uL — ABNORMAL LOW (ref 0.7–4.0)
MCH: 29.4 pg (ref 26.0–34.0)
MCH: 28.8 pg (ref 26.0–34.0)
MCHC: 31.5 g/dL (ref 30.0–36.0)
MCHC: 30.4 g/dL (ref 30.0–36.0)
MCV: 93.3 fL (ref 80.0–100.0)
MCV: 94.8 fL (ref 80.0–100.0)
Monocytes Absolute: 0.5 10*3/uL (ref 0.1–1.0)
Monocytes Absolute: 0.6 10*3/uL (ref 0.1–1.0)
Monocytes Relative: 3 %
Monocytes Relative: 4 %
Neutro Abs: 14.2 10*3/uL — ABNORMAL HIGH (ref 1.7–7.7)
Neutro Abs: 12.6 10*3/uL — ABNORMAL HIGH (ref 1.7–7.7)
Neutrophils Relative %: 93 %
Neutrophils Relative %: 92 %
Platelets: 97 10*3/uL — ABNORMAL LOW (ref 150–400)
Platelets: 89 10*3/uL — ABNORMAL LOW (ref 150–400)
RBC: 4.8 MIL/uL (ref 4.22–5.81)
RBC: 4.97 MIL/uL (ref 4.22–5.81)
RDW: 15.9 % — ABNORMAL HIGH (ref 11.5–15.5)
RDW: 15.7 % — ABNORMAL HIGH (ref 11.5–15.5)
WBC: 15.3 10*3/uL — ABNORMAL HIGH (ref 4.0–10.5)
WBC: 13.6 10*3/uL — ABNORMAL HIGH (ref 4.0–10.5)
nRBC: 0 % (ref 0.0–0.2)
nRBC: 0 % (ref 0.0–0.2)

## 2020-08-10 LAB — URINALYSIS, ROUTINE W REFLEX MICROSCOPIC
Bilirubin Urine: NEGATIVE
Glucose, UA: NEGATIVE mg/dL
Ketones, ur: NEGATIVE mg/dL
Leukocytes,Ua: NEGATIVE
Nitrite: NEGATIVE
Protein, ur: NEGATIVE mg/dL
Specific Gravity, Urine: 1.01 (ref 1.005–1.030)
pH: 5 (ref 5.0–8.0)

## 2020-08-10 LAB — NA AND K (SODIUM & POTASSIUM), RAND UR
Potassium Urine: 22 mmol/L
Sodium, Ur: 132 mmol/L

## 2020-08-10 LAB — RESP PANEL BY RT-PCR (FLU A&B, COVID) ARPGX2
Influenza A by PCR: NEGATIVE
Influenza B by PCR: NEGATIVE
SARS Coronavirus 2 by RT PCR: NEGATIVE

## 2020-08-10 LAB — PROCALCITONIN: Procalcitonin: 0.14 ng/mL

## 2020-08-10 LAB — BASIC METABOLIC PANEL
Anion gap: 9 (ref 5–15)
BUN: 70 mg/dL — ABNORMAL HIGH (ref 8–23)
CO2: 37 mmol/L — ABNORMAL HIGH (ref 22–32)
Calcium: 8.9 mg/dL (ref 8.9–10.3)
Chloride: 104 mmol/L (ref 98–111)
Creatinine, Ser: 1.73 mg/dL — ABNORMAL HIGH (ref 0.61–1.24)
GFR, Estimated: 41 mL/min — ABNORMAL LOW (ref >60)
Glucose, Bld: 97 mg/dL (ref 70–99)
Potassium: 5.6 mmol/L — ABNORMAL HIGH (ref 3.5–5.1)
Sodium: 150 mmol/L — ABNORMAL HIGH (ref 135–145)

## 2020-08-10 LAB — CBG MONITORING, ED: Glucose-Capillary: 94 mg/dL (ref 70–99)

## 2020-08-10 LAB — STREP PNEUMONIAE URINARY ANTIGEN: Strep Pneumo Urinary Antigen: NEGATIVE

## 2020-08-10 LAB — LACTIC ACID, PLASMA
Lactic Acid, Venous: 2.1 mmol/L (ref 0.5–1.9)
Lactic Acid, Venous: 1.3 mmol/L (ref 0.5–1.9)

## 2020-08-10 LAB — TROPONIN I (HIGH SENSITIVITY)
Troponin I (High Sensitivity): 134 ng/L (ref <18)
Troponin I (High Sensitivity): 169 ng/L (ref <18)
Troponin I (High Sensitivity): 156 ng/L (ref <18)

## 2020-08-10 LAB — BRAIN NATRIURETIC PEPTIDE: B Natriuretic Peptide: 64.4 pg/mL (ref 0.0–100.0)

## 2020-08-10 MED ORDER — SODIUM CHLORIDE 0.9 % IV SOLN: 250.0000 mL | INTRAVENOUS | Status: DC | PRN

## 2020-08-10 MED ORDER — ACETAMINOPHEN 650 MG RE SUPP: 650.0000 mg | Freq: Four times a day (QID) | RECTAL | Status: DC | PRN

## 2020-08-10 MED ORDER — HEPARIN (PORCINE) 25000 UT/250ML-% IV SOLN
600.0000 [IU]/h | INTRAVENOUS | Status: AC
  Administered 2020-08-10: 800 [IU]/h via INTRAVENOUS
  Filled 2020-08-10: qty 250

## 2020-08-10 MED ORDER — SODIUM ZIRCONIUM CYCLOSILICATE 10 G PO PACK
10.0000 g | PACK | Freq: Every day | ORAL | Status: DC
  Administered 2020-08-10 – 2020-08-11 (×2): 10 g via ORAL
  Filled 2020-08-10 (×3): qty 1

## 2020-08-10 MED ORDER — ALPRAZOLAM 0.5 MG PO TABS
0.5000 mg | ORAL_TABLET | Freq: Two times a day (BID) | ORAL | Status: DC | PRN
  Administered 2020-08-12: 0.5 mg via ORAL
  Filled 2020-08-10: qty 1

## 2020-08-10 MED ORDER — INSULIN ASPART 100 UNIT/ML IV SOLN
5.0000 [IU] | Freq: Once | INTRAVENOUS | Status: AC
  Administered 2020-08-10: 5 [IU] via INTRAVENOUS

## 2020-08-10 MED ORDER — ALBUTEROL SULFATE (2.5 MG/3ML) 0.083% IN NEBU
10.0000 mg | INHALATION_SOLUTION | Freq: Once | RESPIRATORY_TRACT | Status: AC
  Administered 2020-08-10: 10 mg via RESPIRATORY_TRACT
  Filled 2020-08-10: qty 12

## 2020-08-10 MED ORDER — MOMETASONE FURO-FORMOTEROL FUM 200-5 MCG/ACT IN AERO
2.0000 | INHALATION_SPRAY | Freq: Two times a day (BID) | RESPIRATORY_TRACT | Status: DC
  Administered 2020-08-10 – 2020-08-11 (×2): 2 via RESPIRATORY_TRACT
  Filled 2020-08-10: qty 8.8

## 2020-08-10 MED ORDER — ACETAMINOPHEN 325 MG PO TABS: 650.0000 mg | ORAL_TABLET | Freq: Four times a day (QID) | ORAL | Status: DC | PRN

## 2020-08-10 MED ORDER — ASPIRIN EC 81 MG PO TBEC
81.0000 mg | DELAYED_RELEASE_TABLET | Freq: Every day | ORAL | Status: DC
  Administered 2020-08-10 – 2020-08-11 (×2): 81 mg via ORAL
  Filled 2020-08-10 (×2): qty 1

## 2020-08-10 MED ORDER — OXYCODONE HCL 5 MG PO TABS
10.0000 mg | ORAL_TABLET | Freq: Four times a day (QID) | ORAL | Status: DC | PRN
Start: 2020-08-10 — End: 2020-08-12
  Administered 2020-08-12: 10 mg via ORAL
  Filled 2020-08-10: qty 2

## 2020-08-10 MED ORDER — SODIUM ZIRCONIUM CYCLOSILICATE 10 G PO PACK: 10.0000 g | PACK | Freq: Once | ORAL | Status: DC

## 2020-08-10 MED ORDER — SODIUM CHLORIDE 0.9% FLUSH
3.0000 mL | Freq: Two times a day (BID) | INTRAVENOUS | Status: DC
  Administered 2020-08-10 – 2020-08-11 (×2): 3 mL via INTRAVENOUS

## 2020-08-10 MED ORDER — ATORVASTATIN CALCIUM 10 MG PO TABS
20.0000 mg | ORAL_TABLET | Freq: Every day | ORAL | Status: DC
  Administered 2020-08-10 – 2020-08-11 (×2): 20 mg via ORAL
  Filled 2020-08-10 (×2): qty 2

## 2020-08-10 MED ORDER — DEXTROSE 50 % IV SOLN
1.0000 | Freq: Once | INTRAVENOUS | Status: AC
  Administered 2020-08-10: 50 mL via INTRAVENOUS
  Filled 2020-08-10: qty 50

## 2020-08-10 MED ORDER — METHYLPREDNISOLONE SODIUM SUCC 40 MG IJ SOLR
40.0000 mg | Freq: Two times a day (BID) | INTRAMUSCULAR | Status: DC
  Administered 2020-08-10 – 2020-08-12 (×4): 40 mg via INTRAVENOUS
  Filled 2020-08-10 (×6): qty 1

## 2020-08-10 MED ORDER — ALBUTEROL SULFATE HFA 108 (90 BASE) MCG/ACT IN AERS
1.0000 | INHALATION_SPRAY | RESPIRATORY_TRACT | Status: DC | PRN
  Filled 2020-08-10: qty 6.7

## 2020-08-10 MED ORDER — SODIUM CHLORIDE 0.9 % IV SOLN
1.0000 g | INTRAVENOUS | Status: DC
  Administered 2020-08-11: 1 g via INTRAVENOUS
  Filled 2020-08-10: qty 10

## 2020-08-10 MED ORDER — VALACYCLOVIR HCL 500 MG PO TABS
500.0000 mg | ORAL_TABLET | Freq: Two times a day (BID) | ORAL | Status: DC
  Administered 2020-08-10 – 2020-08-11 (×3): 500 mg via ORAL
  Filled 2020-08-10 (×5): qty 1

## 2020-08-10 MED ORDER — SODIUM CHLORIDE 0.9 % IV SOLN
1.0000 g | Freq: Once | INTRAVENOUS | Status: AC
Start: 2020-08-10 — End: 2020-08-10
  Administered 2020-08-10: 1 g via INTRAVENOUS
  Filled 2020-08-10: qty 10

## 2020-08-10 MED ORDER — IPRATROPIUM-ALBUTEROL 0.5-2.5 (3) MG/3ML IN SOLN
3.0000 mL | Freq: Four times a day (QID) | RESPIRATORY_TRACT | Status: DC
  Administered 2020-08-11 – 2020-08-12 (×6): 3 mL via RESPIRATORY_TRACT
  Filled 2020-08-10 (×6): qty 3

## 2020-08-10 MED ORDER — DILTIAZEM HCL ER COATED BEADS 180 MG PO CP24
180.0000 mg | ORAL_CAPSULE | Freq: Every day | ORAL | Status: DC
  Administered 2020-08-11: 180 mg via ORAL
  Filled 2020-08-10 (×2): qty 1

## 2020-08-10 MED ORDER — SODIUM CHLORIDE 0.9 % IV SOLN
500.0000 mg | INTRAVENOUS | Status: DC
  Administered 2020-08-11: 500 mg via INTRAVENOUS
  Filled 2020-08-10 (×2): qty 500

## 2020-08-10 MED ORDER — SODIUM CHLORIDE 0.9% FLUSH: 3.0000 mL | INTRAVENOUS | Status: DC | PRN

## 2020-08-10 MED ORDER — SODIUM CHLORIDE 0.9 % IV SOLN
500.0000 mg | Freq: Once | INTRAVENOUS | Status: AC
  Administered 2020-08-10: 500 mg via INTRAVENOUS
  Filled 2020-08-10: qty 500

## 2020-08-10 NOTE — ED Notes (Signed)
Patient transported to X-ray 

## 2020-08-10 NOTE — ED Triage Notes (Signed)
Pt bib ems from [facility] c/o abnormal troponin levels. Pt denies chest pain. Pt is on blood thinners. Pt was sleep en route.   BP 150/70 HR 84 8L nasal cannula

## 2020-08-10 NOTE — ED Notes (Signed)
Dr. Eulis Foster informed of I Stat Venous Blood Gas results.

## 2020-08-10 NOTE — ED Provider Notes (Signed)
  Face-to-face evaluation   History: Patient sent here for evaluation of ongoing respiratory difficulty, and elevated troponin.  He is currently in rehab at a local facility.  He is unable to give history.  Level 5 caveat, confusion  Physical exam: Slim male, alert and conversant, lethargic, responsive.  No respiratory distress.  Fair air movement anterior lung fields bilaterally.  Abdomen soft and nontender to palpation.  Medical screening examination/treatment/procedure(s) were conducted as a shared visit with non-physician practitioner(s) and myself.  I personally evaluated the patient during the encounter    Daleen Bo, MD 08/11/20 [phone]

## 2020-08-10 NOTE — ED Provider Notes (Signed)
[facility] EMERGENCY DEPARTMENT Provider Note   CSN: [id number] Arrival date & time: 08/09/2020  1304     History Chief Complaint  Patient presents with   Chest Pain    Andrew Hodges is a 75 y.o. male presenting for evaluation of cough, shortness of breath, chest pain.  Patient states his cough began about 2 weeks ago. Associated SOB.  He has had intermittent chest pain for the past week, rates a 8 out of 10 when it comes on states it is severe and tearing when it happens.  It does not radiate to his back or his abdomen.  When he rests and takes Tylenol his pain improves.  He has needed increased oxygen from his baseline, going from 5 L to 8 today.  He had blood work done at an outside facility which reportedly showed an elevated troponin, thus he was sent to the ER. He takes eliquis, no missed doses. H/o lung cancer in remission.   Additional history obtained from chart review.  History of CHF, emphysema, hyperlipidemia, hypertension, lung cancer, colon cancer.   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 following lung biopsy   Radiation 4/21,4/23,4/27,4/29,5/1   Right upper lobe of lung    Patient Active Problem List   Diagnosis Date Noted   Malignant neoplasm of upper lobe, bronchus or lung 04/19/2013   Lung cancer ([place])         Family History  Problem Relation Age of Onset   Heart disease Mother    Hypertension Mother    Diabetes Mother    Stroke Father    Heart disease Father    Diabetes Father    Cancer Brother        stomach   Cancer Brother        liver    Social History   Tobacco Use   Smoking status: Former    Packs/day: 1.00    Years: 45.00    Pack  years: 45.00    Types: Cigarettes    Quit date: 04/20/2010    Years since quitting: 10.3   Smokeless tobacco: Never  Substance Use Topics   Alcohol use: No    Comment: hx of daily beer   Drug use: No    Home Medications Prior to Admission medications   Medication Sig Start Date End Date Taking? Authorizing Provider  albuterol (PROVENTIL HFA;VENTOLIN HFA) 108 (90 BASE) MCG/ACT inhaler Inhale into the lungs every 6 (six) hours as needed for wheezing or shortness of breath.    [provider]  ALPRAZolam Duanne Moron) 0.25 MG tablet Take 0.25 mg by mouth at bedtime as needed for anxiety.    [provider]  aspirin 325 MG tablet Take 325 mg by mouth daily.    [provider]  budesonide-formoterol (SYMBICORT) 160-4.5 MCG/ACT inhaler Inhale 2 puffs into the lungs 2 (two) times daily.    [provider]  diltiazem (DILACOR XR) 240 MG 24 hr capsule Take 240 mg by mouth 2 (two) times daily.    [provider]  fluticasone (FLONASE) 50 MCG/ACT nasal spray Place into both nostrils 2 (  two) times daily.    [provider]  losartan-hydrochlorothiazide (HYZAAR) 50-12.5 MG per tablet Take 1 tablet by mouth daily.    [provider]  Multiple Vitamin (MULTIVITAMIN) tablet Take 1 tablet by mouth daily.    [provider]  oxyCODONE (OXY IR/ROXICODONE) 5 MG immediate release tablet  04/28/13   [provider]  roflumilast (DALIRESP) 500 MCG TABS tablet Take 500 mcg by mouth daily.    [provider]    Allergies    Patient has no known allergies.  Review of Systems   Review of Systems  Respiratory:  Positive for cough and shortness of breath.   Cardiovascular:  Positive for chest pain.  All other systems reviewed and are negative.  Physical Exam Updated Vital Signs BP (!) 154/82 (BP Location: Left Arm)   Pulse 79   Temp 98.1 F (36.7 C) (Oral)   Resp (!) 32   SpO2 95%   Physical Exam Vitals and nursing note  reviewed.  Constitutional:      General: He is not in acute distress.    Appearance: Normal appearance. He is ill-appearing.     Comments: Appears chronically ill.  Sallow tone.  HENT:     Head: Normocephalic and atraumatic.  Eyes:     Extraocular Movements: Extraocular movements intact.     Conjunctiva/sclera: Conjunctivae normal.     Pupils: Pupils are equal, round, and reactive to light.  Cardiovascular:     Rate and Rhythm: Normal rate and regular rhythm.     Pulses: Normal pulses.  Pulmonary:     Effort: Pulmonary effort is normal. Tachypnea present. No respiratory distress.     Breath sounds: Decreased air movement present. Decreased breath sounds present. No wheezing.     Comments: Speaking in short sentences.  Sats in the mid 90s on 8 L via nasal cannula.  Tachypneic in the mid 30s. Rhonchi in lower bases bilaterally, but overall distant lung sounds.  Abdominal:     General: There is no distension.     Palpations: Abdomen is soft. There is no mass.     Tenderness: There is no abdominal tenderness. There is no guarding or rebound.  Musculoskeletal:        General: Normal range of motion.     Cervical back: Normal range of motion and neck supple.     Right lower leg: Edema present.     Left lower leg: Edema present.  Skin:    General: Skin is warm and dry.     Capillary Refill: Capillary refill takes less than 2 seconds.  Neurological:     Mental Status: He is alert and oriented to person, place, and time.  Psychiatric:        Mood and Affect: Mood and affect normal.        Speech: Speech normal.        Behavior: Behavior normal.    ED Results / Procedures / Treatments   Labs (all labs ordered are listed, but only abnormal results are displayed) Labs Reviewed  COMPREHENSIVE METABOLIC PANEL - Abnormal; Notable for the following components:      Result Value   Sodium 148 (*)    Potassium 5.7 (*)    CO2 33 (*)    Glucose, Bld 124 (*)    BUN 73 (*)    Creatinine,  Ser 1.72 (*)    Total Protein 5.7 (*)    Albumin 3.1 (*)    GFR, Estimated 42 (*)  All other components within normal limits  CBC WITH DIFFERENTIAL/PLATELET - Abnormal; Notable for the following components:   WBC 13.6 (*)    RDW 15.7 (*)    Platelets 89 (*)    Neutro Abs 12.6 (*)    Lymphs Abs 0.4 (*)    All other components within normal limits  LACTIC ACID, PLASMA - Abnormal; Notable for the following components:   Lactic Acid, Venous 2.1 (*)    All other components within normal limits  I-STAT VENOUS BLOOD GAS, ED - Abnormal; Notable for the following components:   pCO2, Ven 69.8 (*)    Bicarbonate 40.4 (*)    TCO2 42 (*)    Acid-Base Excess 12.0 (*)    Sodium 146 (*)    Potassium 5.4 (*)    All other components within normal limits  TROPONIN I (HIGH SENSITIVITY) - Abnormal; Notable for the following components:   Troponin I (High Sensitivity) 134 (*)    All other components within normal limits  TROPONIN I (HIGH SENSITIVITY) - Abnormal; Notable for the following components:   Troponin I (High Sensitivity) 169 (*)    All other components within normal limits  RESP PANEL BY RT-PCR (FLU A&B, COVID) ARPGX2  CULTURE, BLOOD (ROUTINE X 2)  CULTURE, BLOOD (ROUTINE X 2)  BRAIN NATRIURETIC PEPTIDE  CBC WITH DIFFERENTIAL/PLATELET  LACTIC ACID, PLASMA    EKG EKG Interpretation  Date/Time:  Monday August 10 2020 15:08:32 EDT Ventricular Rate:  93 PR Interval:  183 QRS Duration: 87 QT Interval:  341 QTC Calculation: 425 R Axis:   262 Text Interpretation: Sinus rhythm Probable left atrial enlargement Inferior infarct, old Anterior infarct, old Since last tracing of earlier today No significant change was found Confirmed by Daleen Bo [id number]) on 08/03/2020 3:11:39 PM  Radiology DG Chest 2 View  Result Date: 08/15/2020 CLINICAL DATA:  75 year old male with chest pain and shortness of breath. Cough. EXAM: CHEST - 2 VIEW COMPARISON:  None FINDINGS: Patchy area airspace density  at the right lung base may represent atelectasis or infiltrate. Clinical correlation and follow-up to resolution recommended. Trace right pleural effusion may be present. A 14 mm nodular density in the right suprahilar region may represent an area of scarring or confluence of vasculature. Direct comparison with prior images, if available, recommended. If no prior images are available attention on close follow-up imaging or further evaluation with chest CT is recommended. The left lung is clear. No pneumothorax. Top-normal cardiac size. Atherosclerotic calcification of the aorta. No acute osseous pathology. IMPRESSION: 1. Right lung base atelectasis or infiltrate. Clinical correlation and follow-up to resolution recommended. 2. A 14 mm nodular density in the right suprahilar region. Direct comparison with prior or follow-up imaging studies or further evaluation with chest CT on a nonemergent/outpatient basis recommended. Electronically Signed   By: Anner Crete M.D.   On: 07/20/2020 15:20    Procedures Procedures   Medications Ordered in ED Medications  sodium zirconium cyclosilicate (LOKELMA) packet 10 g (has no administration in time range)  cefTRIAXone (ROCEPHIN) 1 g in sodium chloride 0.9 % 100 mL IVPB (has no administration in time range)  azithromycin (ZITHROMAX) 500 mg in sodium chloride 0.9 % 250 mL IVPB (has no administration in time range)    ED Course  I have reviewed the triage vital signs and the nursing notes.  Pertinent labs & imaging results that were available during my care of the patient were reviewed by me and considered in my medical decision making (see  chart for details).    MDM Rules/Calculators/A&P                            Patient presenting for evaluation of chest pain, shortness of breath, cough.  Also found to have an elevated troponin at outside facility.  On my evaluation, patient appears ill.  He has distant lung sounds with rhonchi in the bases.  He does  have some pitting edema, consider CHF.  However patient feels warm, in the setting of tachypnea, cough, shortness of breath, concern for pneumonia.  Less likely cardiac cause, elevated troponins likely due to demand.  We will recheck troponin today.  Will check labs, EKG, chest x-ray.   Chest x-ray viewed and independently interpreted by me, consistent with right lower lobe pneumonia.  EKG without STEMI.  Labs concerning for slightly elevated white count of 13 and mildly elevated lactic of 2.1.  Blood cultures ordered and IV antibiotics started.  Troponin is elevated at 134, however this is likely due to demand, especially as I have concern for pulmonary cause as opposed to cardiac cause.  Will trend.  Repeat troponin similar at 160.  Will call for admission.   Discussed with Dr. Roel Cluck from [facility] hospitalist service, patient to be admitted.   Final Clinical Impression(s) / ED Diagnoses Final diagnoses:  Community acquired pneumonia of right lower lobe of lung    Rx / DC Orders ED Discharge Orders     None        Franchot Heidelberg, PA-C 08/09/2020 1846    Daleen Bo, MD 08/11/20 1801

## 2020-08-10 NOTE — Progress Notes (Signed)
ANTICOAGULATION CONSULT NOTE - Initial Consult  Pharmacy Consult for Heparin Indication: chest pain/ACS  No Known Allergies  Patient Measurements:   Heparin Dosing Weight: 65.8kg  Vital Signs: Temp: 98.1 F (36.7 C) (07/25 1336) Temp Source: Oral (07/25 1336) BP: 149/83 (07/25 1856) Pulse Rate: 88 (07/25 1856)  Labs: Recent Labs    08/09/20 1900 07/22/2020 1507 07/17/2020 1623 07/17/2020 1632  HGB 14.1  13.9  --  14.3 15.3  HCT 44.8  44.3  --  47.1 45.0  PLT 97*  98*  --  89*  --   CREATININE  --  1.72*  --   --   TROPONINIHS  --  134* 169*  --     CrCl cannot be calculated (Unknown ideal weight.).   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 following lung biopsy   Radiation 4/21,4/23,4/27,4/29,5/1   Right upper lobe of lung    Medications:  (Not in a hospital admission)  Scheduled:   sodium zirconium cyclosilicate  10 g Oral Daily   Infusions:   Assessment: 89 yom presenting with chest pain and shortness of breath. Heparin consult per pharmacy ordered.  Patient taking apixaban prior to arrival for AF. Last dose 7/24 0900. Patient may have been initiated on heparin infusion while at [facility]; however, patient has been here since 1304 without heparin infusion.  CBC stable; Plt 89. Baseline coags ordered.  Goal of Therapy:  Heparin level 0.3-0.7 units/ml aPTT 66-102 seconds Monitor platelets by anticoagulation protocol: Yes   Plan:  No initial heparin bolus Start heparin infusion at 800 units/hr Check anti-Xa and aPTT level in 8 hours and daily while on heparin] Continue to check anti-xa levels and aPTT levels until correlated. Continue to monitor H&H and  platelets  Lorelei Pont, PharmD, BCPS 08/08/2020 8:15 PM ED Clinical Pharmacist -  [phone]

## 2020-08-10 NOTE — H&P (Signed)
Andrew Hodges:[id number] DOB: [birth year] DOA: 08/13/2020     PCP: Merryl Hacker No   Outpatient Specialists:      Oncology   Dr. Baird Cancer, Yolanda Bonine, MD      Patient arrived to ER on 07/26/2020 at 1304 Referred by Attending Toy Baker, MD   Patient coming from: From facility [facility]  Chief Complaint:  Chief Complaint  Patient presents with   Chest Pain    HPI: Andrew Hodges is a 75 y.o. male with medical history significant of [place] ca, Lung CA  DVT (on Eliquis), HTN,HLD,severe COPD on home O2, sleep apnea, atrial fibrillation on apixaban  Presented with   chest pain shortness of breath worsening oxygen requirement at this point requiring up to 8 L.  Patient recently was admitted for COPD eaxecerbation was given IV steroids nebulizer treatments given a dose of diuretics.  His kidney function had declined with creatinine maxing out at 3.0.  ABG showed evidence of hypercapnia he was placed on BiPAP showed very slow improvement with respiratory function he was treated 8 days with IV ceftriaxone and azithromycin as well as IV Solu-Medrol duo nebs and Pulmicort eventually was able to transition to nasal cannula but had to require BiPAP intermittently.  Amlodipine had to be added to his blood pressure regimen due to episodes of hypertension.  Eventually he was able to wean off of BiPAP and transition to [facility] on 05 August 2020 he continued to have some dyspnea throughout. Patient have had chronic pain for which she has been using Oxy IR as well as an anxiety for which she has been using alprazolam 1 mg every 8 hours.  Recently his oxycodone was weaned down He started to report some chest pain troponins was drawn and noted to be elevated at which point he was transferred to [facility], ER    Has  been vaccinated against COVID  and boosted   Initial COVID TEST  NEGATIVE   Lab Results  Component Value Date   [place] NEGATIVE 07/17/2020     Regarding pertinent  Chronic problems:    Hyperlipidemia -  on statins [place] Lipid Panel     COLON CA sp subtotal colectomy 09/29/2016 by Dr Morton Stall preop 07/14/2016 showed stage I colon cancer; post colectomy pathology : biopsy just had a tubular adenoma with high-grade dysplasia. All lymph nodes were negative.  Lung CA stage I lung cancer I believe was treated with stereotactic radiation therapy, has been stable  Gout on Allopurinol,   HTN on   losartan, HYGROTON, diltiazem, norvasc   CAD  - On Aspirin, statin, betablocker             COPD -  followed by pulmonology   on baseline oxygen  2L,   On TRELEGY ELLIPTA) Has been on daily dexamethasone 4 mg daily   OSA -on nocturnal oxygen,     A. Fib -  - CHA2DS2 vas score >= 2 P  on Eliquis          -  Rate control:  Currently controlled with  Diltiazem,        Hx of DVT/PE on - anticoagulation with  Eliquis,    CKD stage IIIb- baseline Cr  1.5 CrCl cannot be calculated (Unknown ideal weight.).  Lab Results  Component Value Date   CREATININE 1.72 (H) 08/13/2020     While in ER: Noted to need up to 8L of O2 up from baseline  CP now resolved K  noted wt 5.7 given lokelma  WBC on 13.6 but pt is on steroids ED Triage Vitals  Enc Vitals Group     BP 08/05/2020 1336 (!) 176/83     Pulse Rate 08/09/2020 1336 96     Resp 07/21/2020 1336 (!) 33     Temp 07/26/2020 1336 98.1 F (36.7 C)     Temp Source 07/20/2020 1336 Oral     SpO2 08/04/2020 1336 93 %     Weight --      Height --      Head Circumference --      Peak Flow --      Pain Score 07/29/2020 1337 0     Pain Loc --      Pain Edu? --      Excl. in [place]? --   TMAX(24)@     _________________________________________ Significant initial  Findings: Abnormal Labs Reviewed  COMPREHENSIVE METABOLIC PANEL - Abnormal; Notable for the following components:      Result Value   Sodium 148 (*)    Potassium 5.7 (*)    CO2 33 (*)    Glucose, Bld 124 (*)    BUN 73 (*)    Creatinine, Ser 1.72 (*)    Total Protein  5.7 (*)    Albumin 3.1 (*)    GFR, Estimated 42 (*)    All other components within normal limits  CBC WITH DIFFERENTIAL/PLATELET - Abnormal; Notable for the following components:   WBC 13.6 (*)    RDW 15.7 (*)    Platelets 89 (*)    Neutro Abs 12.6 (*)    Lymphs Abs 0.4 (*)    All other components within normal limits  LACTIC ACID, PLASMA - Abnormal; Notable for the following components:   Lactic Acid, Venous 2.1 (*)    All other components within normal limits  I-STAT VENOUS BLOOD GAS, ED - Abnormal; Notable for the following components:   pCO2, Ven 69.8 (*)    Bicarbonate 40.4 (*)    TCO2 42 (*)    Acid-Base Excess 12.0 (*)    Sodium 146 (*)    Potassium 5.4 (*)    All other components within normal limits  TROPONIN I (HIGH SENSITIVITY) - Abnormal; Notable for the following components:   Troponin I (High Sensitivity) 134 (*)    All other components within normal limits  TROPONIN I (HIGH SENSITIVITY) - Abnormal; Notable for the following components:   Troponin I (High Sensitivity) 169 (*)    All other components within normal limits   ____________________________________________ Ordered    CXR -Right lung base atelectasis or infiltrate   _______________________ Troponin 138 - 169 ECG: Ordered Personally reviewed by me showing: HR : 93 Rhythm:  NSR LVH  nonspecific changes,  [id number]   The recent clinical data is shown below. Vitals:   07/22/2020 1400 08/06/2020 1430 07/20/2020 1638 07/24/2020 1856  BP: (!) 155/73 (!) 148/67 (!) 154/82 (!) 149/83  Pulse: 92 90 79 88  Resp: (!) 36 (!) 35 (!) 32 20  Temp:      TempSrc:      SpO2: 95% 95% 95% 95%      WBC     Component Value Date/Time   WBC 13.6 (H) 08/02/2020 1623   LYMPHSABS 0.4 (L) 07/27/2020 1623   MONOABS 0.6 08/16/2020 1623   EOSABS 0.0 07/28/2020 1623   BASOSABS 0.0 08/06/2020 1623    Lactic Acid, Venous    Component Value Date/Time   LATICACIDVEN 2.1 (HH)  07/30/2020 1636     Procalcitonin 0.14 Lactic  Acid, Venous    Component Value Date/Time   LATICACIDVEN 1.3 08/07/2020 1850     UA   no evidence of UTI     Urine analysis:    Component Value Date/Time   COLORURINE YELLOW 08/09/2020 2057   APPEARANCEUR CLEAR 07/26/2020 2057   LABSPEC 1.010 07/25/2020 2057   PHURINE 5.0 08/16/2020 2057   GLUCOSEU NEGATIVE 07/26/2020 2057   HGBUR SMALL (A) 07/25/2020 2057   BILIRUBINUR NEGATIVE 07/23/2020 2057   KETONESUR NEGATIVE 07/18/2020 2057   PROTEINUR NEGATIVE 08/01/2020 2057   NITRITE NEGATIVE 08/03/2020 2057   LEUKOCYTESUR NEGATIVE 07/26/2020 2057    Results for orders placed or performed during the hospital encounter of 08/16/2020  Resp Panel by RT-PCR (Flu A&B, Covid) Nasopharyngeal Swab     Status: None   Collection Time: 07/24/2020  4:25 PM   Specimen: Nasopharyngeal Swab; Nasopharyngeal(NP) swabs in vial transport medium  Result Value Ref Range Status   SARS Coronavirus 2 by RT PCR NEGATIVE NEGATIVE Final         Influenza A by PCR NEGATIVE NEGATIVE Final   Influenza B by PCR NEGATIVE NEGATIVE Final           _______________________________________________ Hospitalist was called for admission for acute on chronic respiratory  failure and CP    The following Work up has been ordered so far:  Orders Placed This Encounter  Procedures   Resp Panel by RT-PCR (Flu A&B, Covid) Nasopharyngeal Swab   Blood culture (routine x 2)   DG Chest 2 View   CBC with Differential   Comprehensive metabolic panel   Brain natriuretic peptide   CBC with Differential/Platelet   Lactic acid, plasma   Check Rectal Temperature   Cardiac monitoring   Consult for Unassigned Medical Admission   I-Stat venous blood gas, [facility] ED only)   ED EKG   Admit to Inpatient (patient's expected length of stay will be greater than 2 midnights or inpatient only procedure)      Following Medications were ordered in ER: Medications  sodium zirconium cyclosilicate (LOKELMA) packet 10 g (has no administration in  time range)  cefTRIAXone (ROCEPHIN) 1 g in sodium chloride 0.9 % 100 mL IVPB (1 g Intravenous New Bag/Given 08/11/2020 1857)  azithromycin (ZITHROMAX) 500 mg in sodium chloride 0.9 % 250 mL IVPB (500 mg Intravenous New Bag/Given 07/22/2020 1900)     OTHER Significant initial  Findings:  labs showing:    Recent Labs  Lab 08/01/2020 1507 08/01/2020 1632 07/31/2020 2024  NA 148* 146* 150*  K 5.7* 5.4* 5.6*  CO2 33*  --  37*  GLUCOSE 124*  --  97  BUN 73*  --  70*  CREATININE 1.72*  --  1.73*  CALCIUM 9.2  --  8.9    Cr     Up from baseline see below Lab Results  Component Value Date   CREATININE 1.72 (H) 08/15/2020    Recent Labs  Lab 07/29/2020 1507  AST 29  ALT 33  ALKPHOS 45  BILITOT 0.6  PROT 5.7*  ALBUMIN 3.1*   Lab Results  Component Value Date   CALCIUM 9.2 08/11/2020        Plt: Lab Results  Component Value Date   PLT 89 (L) 07/26/2020       Venous  Blood Gas result:  pH 7.37 pCO2 68   ABG    Component Value Date/Time   HCO3 40.4 (H) 08/02/2020  1632   TCO2 42 (H) 07/29/2020 1632   O2SAT 77.0 07/24/2020 1632         Recent Labs  Lab 08/09/20 1900 08/14/2020 1623 07/20/2020 1632  WBC 15.3*  14.8* 13.6*  --   NEUTROABS 14.2* 12.6*  --   HGB 14.1  13.9 14.3 15.3  HCT 44.8  44.3 47.1 45.0  MCV 93.3  93.9 94.8  --   PLT 97*  98* 89*  --     HG/HCT  stable,     Component Value Date/Time   HGB 15.3 08/15/2020 1632   HCT 45.0 07/24/2020 1632   MCV 94.8 07/27/2020 1623     BNP (last 3 results) Recent Labs    07/31/2020 1623  BNP 64.4      Cultures: No results found for: SDES, SPECREQUEST, CULT, REPTSTATUS   Radiological Exams on Admission: DG Chest 2 View  Result Date: 07/27/2020 CLINICAL DATA:  75 year old male with chest pain and shortness of breath. Cough. EXAM: CHEST - 2 VIEW COMPARISON:  None FINDINGS: Patchy area airspace density at the right lung base may represent atelectasis or infiltrate. Clinical correlation and follow-up to  resolution recommended. Trace right pleural effusion may be present. A 14 mm nodular density in the right suprahilar region may represent an area of scarring or confluence of vasculature. Direct comparison with prior images, if available, recommended. If no prior images are available attention on close follow-up imaging or further evaluation with chest CT is recommended. The left lung is clear. No pneumothorax. Top-normal cardiac size. Atherosclerotic calcification of the aorta. No acute osseous pathology. IMPRESSION: 1. Right lung base atelectasis or infiltrate. Clinical correlation and follow-up to resolution recommended. 2. A 14 mm nodular density in the right suprahilar region. Direct comparison with prior or follow-up imaging studies or further evaluation with chest CT on a nonemergent/outpatient basis recommended. Electronically Signed   By: Anner Crete M.D.   On: 08/09/2020 15:20   _______________________________________________________________________________________________________ Latest  Blood pressure (!) 149/83, pulse 88, temperature 98.1 F (36.7 C), temperature source Oral, resp. rate 20, SpO2 95 %.   Review of Systems:    Pertinent positives include:  fatigue, chest pain,  shortness of breath at rest. Constitutional:  No weight loss, night sweats, Fevers, chills, , weight loss  HEENT:  No headaches, Difficulty swallowing,Tooth/dental problems,Sore throat,  No sneezing, itching, ear ache, nasal congestion, post nasal drip,  Cardio-vascular:  No  Orthopnea, PND, anasarca, dizziness, palpitations.no Bilateral lower extremity swelling  GI:  No heartburn, indigestion, abdominal pain, nausea, vomiting, diarrhea, change in bowel habits, loss of appetite, melena, blood in stool, hematemesis Resp:  no  No dyspnea on exertion, No excess mucus, no productive cough, No non-productive cough, No coughing up of blood.No change in color of mucus.No wheezing. Skin:  no rash or lesions. No  jaundice GU:  no dysuria, change in color of urine, no urgency or frequency. No straining to urinate.  No flank pain.  Musculoskeletal:  No joint pain or no joint swelling. No decreased range of motion. No back pain.  Psych:  No change in mood or affect. No depression or anxiety. No memory loss.  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 following lung biopsy   Radiation 4/21,4/23,4/27,4/29,5/1   Right upper lobe of lung    Past Surgical History:  Procedure Laterality Date   COLON SURGERY     EXCISIONAL HEMORRHOIDECTOMY     LUMBAR LAMINECTOMY     many years ago   LUNG BIOPSY  04/01/2013   RUL- adenocarcinoma    Social History:  Ambulatory  bed bound     reports that he quit smoking about 10 years ago. He has a 45.00 pack-year smoking history. He has never used smokeless tobacco. He reports that he does not drink alcohol and does not use drugs.     Family History:   Family History  Problem Relation Age of Onset   Heart disease Mother    Hypertension Mother    Diabetes Mother    Stroke Father    Heart disease Father    Diabetes Father    Cancer Brother        stomach   Cancer Brother        liver   ______________________________________________________________________________________________ Allergies: No Known Allergies   Prior to Admission medications   Medication Sig Start  Date End Date Taking? Authorizing Provider  albuterol (PROVENTIL HFA;VENTOLIN HFA) 108 (90 BASE) MCG/ACT inhaler Inhale into the lungs every 6 (six) hours as needed for wheezing or shortness of breath.    [provider]  ALPRAZolam Duanne Moron) 0.25 MG tablet Take 0.25 mg by mouth at bedtime as needed for anxiety.    [provider]  aspirin 325 MG tablet Take 325 mg by mouth daily.    [provider]  budesonide-formoterol (SYMBICORT) 160-4.5 MCG/ACT inhaler Inhale 2 puffs into the lungs 2 (two) times daily.    [provider]  diltiazem (DILACOR XR) 240 MG 24 hr capsule Take 240 mg by mouth 2 (two) times daily.    [provider]  fluticasone (FLONASE) 50 MCG/ACT nasal spray Place into both nostrils 2 (two) times daily.    [provider]  losartan-hydrochlorothiazide (HYZAAR) 50-12.5 MG per tablet Take 1 tablet by mouth daily.    [provider]  Multiple Vitamin (MULTIVITAMIN) tablet Take 1 tablet by mouth daily.    [provider]  oxyCODONE (OXY IR/ROXICODONE) 5 MG immediate release tablet  04/28/13   [provider]  roflumilast (DALIRESP) 500 MCG TABS tablet Take 500 mcg by mouth daily.    [provider]    ___________________________________________________________________________________________________ Physical Exam: Vitals with BMI 07/29/2020 07/31/2020 08/06/2020  Height - - -  Weight - - -  BMI - - -  Systolic 938 182 993  Diastolic 83 82 67  Pulse 88 79 90      1. General:  in No  Acute distress   Chronically ill   -appearing 2. Psychological: Alert and   Oriented 3. Head/ENT Dry Mucous Membranes                          Head Non traumatic, neck supple                           Poor Dentition 4. SKIN: decreased Skin turgor,  Skin clean Dry and intact no rash 5.  Heart: Regular rate and rhythm systolic Murmur, no Rub or gallop 6. Lungs: no wheezes some crackles   7. Abdomen: Soft,   non-tender, Non distended  bowel sounds present 8. Lower extremities: no clubbing, cyanosis, trace edema 9. Neurologically Grossly intact, moving all 4 extremities equally   10. MSK: Normal range of motion    Chart has been reviewed  ______________________________________________________________________________________________  Assessment/Plan 75 y.o. male with medical history significant of [place] ca, Lung CA  DVT (on Eliquis), HTN,HLD,severe COPD on home O2, sleep apnea, atrial fibrillation on apixaban  Admitted for elevated troponin and acute on chronic respiratory failure    Present on Admission:  HCAP (healthcare-associated pneumonia) question if true PNA vs COPD exascerbation with chronic end stage COPD - for tonight cover with IV antibiotics obtain sputum culture MRSA, strep pneumo antigen. Continue COPD medication make sure on albuterol as needed and scheduled DuoNeb. Patient was apparently on longstanding steroids presumably for COPD and stage.  Given worsening shortness of breath increased work of breathing and occasional wheezing will change to Solu-Medrol 40 twice daily and monitor.   Elevated troponin patient did at some point endorse chest pain troponin appears to be somewhat stable for now.  Will discuss with cardiology obtain echogram patient at [facility] was started on heparin drip for now we will continue defer to cardiology make sure he is on daily aspirin further evaluation pending results of work-up  End-stage COPD.  We will obtain palliative care consult continue home medications  Acute on chronic respiratory failure with hypoxia/hypercarbia -  this patient has acute respiratory failure with Hypoxia and  Hypercarbia   Likely due to:   Pneumonia,  , COPD exacerbation,  Provide O2 therapy and titrate as needed  Continuous pulse ox Will try to diurese Given increased work of breathing will try BiPAP Pt has required BipAP during past admissions Would benefit from  pulmonology consult in AM given known severe COPD     Debility with functional quadriplegia.  PT OT assessment   CAD (coronary artery disease) make sure on statin and aspirin.  Appreciate cardiology involvement   Essential hypertension hold ARB given elevated potassium.  Continue diltiazem and monitor   Hyperkalemia in the setting of potassium supplementation being on ARB and chronic kidney disease.  We will hold potassium hold ARB patient was given Lokelma in the ED.  Will follow bMET and treat further as needed  CKD -  -chronic avoid nephrotoxic medications such as NSAIDs, Vanco Zosyn combo,  avoid hypotension, continue to follow renal function   Hyperlipidemia chronic stable continue statin  Paroxysmal atrial fibrillation.  Resume diltiazem current anticoagulation with heparin for now and then can resume home anticoagulation  History of lung cancer status post radiation therapy stable Other plan as per orders.  DVT prophylaxis:  heparin    Code Status:    Code Status: Not on file FULL CODE  care as per patient   I had personally discussed CODE STATUS with patient      Family Communication:   Family not at  Bedside    Disposition Plan:                             Back to current facility when stable                    Following barriers for discharge:  Electrolytes corrected                                                         Pain controlled with PO medications                          white count improving able to transition to PO antibiotics                             Will need to be able to tolerate PO                            Will likely need home health, home O2, set up                                       Would benefit from PT/OT eval prior to DC  Ordered                   Swallow eval - SLP ordered                                       Transition of care consulted                   Nutrition    consulted                             Palliative care    consulted                                     Consults called:   consult cardiology, sent msg to PCCM   Admission status:  ED Disposition     ED Disposition  [place]: [place] [[id number]]  Level of Care: Progressive [102]  Admit to Progressive based on following criteria: RESPIRATORY PROBLEMS hypoxemic/hypercapnic respiratory failure that is responsive to NIPPV (BiPAP) or High Flow Nasal Cannula (6-80 lpm). Frequent assessment/intervention, no > Q2 hrs < Q4 hrs, to maintain oxygenation and pulmonary hygiene.  May admit patient to [facility] or [facility] if equivalent level of care is available:: No  Covid Evaluation: Confirmed COVID Negative  Diagnosis: HCAP (healthcare-associated pneumonia) [324401]  Admitting Physician: Toy Baker [[id number]]  Attending Physician: Toy Baker [[id number]]  Estimated length of stay: past midnight tomorrow  Certification:: I certify this patient will need inpatient services for at least 2 midnights         inpatient     I Expect 2 midnight stay secondary to severity of patient's current illness need for inpatient interventions justified by the following:  hemodynamic instability despite optimal treatment ( tachypnea  hypoxia )  Severe lab/radiological/exam abnormalities including:    CAP and extensive comorbidities including:   CAD  COPD/asthma   CKD    malignancy,   Chronic  anticoagulation  That are currently affecting medical management.   I expect  patient to be hospitalized for 2 midnights requiring inpatient medical care.  Patient is at high risk for adverse outcome (such as loss of life or disability) if not treated.  Indication for inpatient stay as follows:     persistent chest pain despite medical management   New or worsening hypoxia  Need for IV antibiotics, IV fluids,     Level of care   progressive tele indefinitely please  discontinue once patient no longer qualifies COVID-19 Labs    Lab Results  Component Value Date   [place] 07/30/2020     Precautions: admitted as  Covid Negative      PPE: Used by the provider:   N95  eye Goggles,  Gloves    Bruno Leach 07/23/2020, 1:29 AM    [facility]     after 2 AM please page floor coverage PA If 7AM-7PM, please contact the day team taking care of the patient using [email or web address]   Patient was evaluated in the context of the global COVID-19 pandemic, which necessitated consideration that the patient might be at risk for infection with the SARS-CoV-2 virus that causes COVID-19. Institutional protocols and algorithms that pertain to the evaluation of patients at risk for COVID-19 are in a state of rapid change based on information released by regulatory bodies including the CDC and federal and state organizations. These policies and algorithms were followed during the patient's care.

## 2020-08-11 ENCOUNTER — Inpatient Hospital Stay (HOSPITAL_COMMUNITY): Payer: Medicare HMO

## 2020-08-11 ENCOUNTER — Encounter (HOSPITAL_COMMUNITY): Payer: Self-pay | Admitting: Internal Medicine

## 2020-08-11 DIAGNOSIS — R778 Other specified abnormalities of plasma proteins: Secondary | ICD-10-CM | POA: Diagnosis not present

## 2020-08-11 DIAGNOSIS — R079 Chest pain, unspecified: Secondary | ICD-10-CM | POA: Diagnosis not present

## 2020-08-11 DIAGNOSIS — E7800 Pure hypercholesterolemia, unspecified: Secondary | ICD-10-CM

## 2020-08-11 DIAGNOSIS — J9621 Acute and chronic respiratory failure with hypoxia: Secondary | ICD-10-CM | POA: Diagnosis not present

## 2020-08-11 DIAGNOSIS — I2510 Atherosclerotic heart disease of native coronary artery without angina pectoris: Secondary | ICD-10-CM | POA: Diagnosis not present

## 2020-08-11 DIAGNOSIS — I10 Essential (primary) hypertension: Secondary | ICD-10-CM | POA: Diagnosis not present

## 2020-08-11 LAB — COMPREHENSIVE METABOLIC PANEL
ALT: 30 U/L (ref 0–44)
AST: 28 U/L (ref 15–41)
Albumin: 2.8 g/dL — ABNORMAL LOW (ref 3.5–5.0)
Alkaline Phosphatase: 42 U/L (ref 38–126)
Anion gap: 7 (ref 5–15)
BUN: 68 mg/dL — ABNORMAL HIGH (ref 8–23)
CO2: 39 mmol/L — ABNORMAL HIGH (ref 22–32)
Calcium: 9 mg/dL (ref 8.9–10.3)
Chloride: 102 mmol/L (ref 98–111)
Creatinine, Ser: 1.73 mg/dL — ABNORMAL HIGH (ref 0.61–1.24)
GFR, Estimated: 41 mL/min — ABNORMAL LOW (ref >60)
Glucose, Bld: 101 mg/dL — ABNORMAL HIGH (ref 70–99)
Potassium: 5.3 mmol/L — ABNORMAL HIGH (ref 3.5–5.1)
Sodium: 148 mmol/L — ABNORMAL HIGH (ref 135–145)
Total Bilirubin: 0.6 mg/dL (ref 0.3–1.2)
Total Protein: 5.4 g/dL — ABNORMAL LOW (ref 6.5–8.1)

## 2020-08-11 LAB — CBC WITH DIFFERENTIAL/PLATELET
Abs Immature Granulocytes: 0.03 10*3/uL (ref 0.00–0.07)
Basophils Absolute: 0 10*3/uL (ref 0.0–0.1)
Basophils Relative: 0 %
Eosinophils Absolute: 0 10*3/uL (ref 0.0–0.5)
Eosinophils Relative: 0 %
HCT: 45.3 % (ref 39.0–52.0)
Hemoglobin: 13.5 g/dL (ref 13.0–17.0)
Immature Granulocytes: 0 %
Lymphocytes Relative: 3 %
Lymphs Abs: 0.3 10*3/uL — ABNORMAL LOW (ref 0.7–4.0)
MCH: 28.2 pg (ref 26.0–34.0)
MCHC: 29.8 g/dL — ABNORMAL LOW (ref 30.0–36.0)
MCV: 94.8 fL (ref 80.0–100.0)
Monocytes Absolute: 0.1 10*3/uL (ref 0.1–1.0)
Monocytes Relative: 1 %
Neutro Abs: 10.1 10*3/uL — ABNORMAL HIGH (ref 1.7–7.7)
Neutrophils Relative %: 96 %
Platelets: 76 10*3/uL — ABNORMAL LOW (ref 150–400)
RBC: 4.78 MIL/uL (ref 4.22–5.81)
RDW: 15.6 % — ABNORMAL HIGH (ref 11.5–15.5)
WBC: 10.5 10*3/uL (ref 4.0–10.5)
nRBC: 0 % (ref 0.0–0.2)

## 2020-08-11 LAB — HEPARIN LEVEL (UNFRACTIONATED)
Heparin Unfractionated: >1.1 IU/mL — ABNORMAL HIGH (ref 0.30–0.70)
Heparin Unfractionated: >1.1 IU/mL — ABNORMAL HIGH (ref 0.30–0.70)
Heparin Unfractionated: >1.1 IU/mL — ABNORMAL HIGH (ref 0.30–0.70)

## 2020-08-11 LAB — APTT
aPTT: 29 seconds (ref 24–36)
aPTT: 115 seconds — ABNORMAL HIGH (ref 24–36)
aPTT: 118 seconds — ABNORMAL HIGH (ref 24–36)

## 2020-08-11 LAB — TSH: TSH: 1.33 u[IU]/mL (ref 0.350–4.500)

## 2020-08-11 LAB — ECHOCARDIOGRAM COMPLETE
Area-P 1/2: 3.27 cm2
Calc EF: 64.3 %
Height: 69 in
S' Lateral: 2.1 cm
Single Plane A2C EF: 69.6 %
Single Plane A4C EF: 59.9 %
Weight: 2320 oz

## 2020-08-11 LAB — MAGNESIUM
Magnesium: 2.2 mg/dL (ref 1.7–2.4)
Magnesium: 2 mg/dL (ref 1.7–2.4)

## 2020-08-11 LAB — PROTIME-INR
INR: 1.2 (ref 0.8–1.2)
Prothrombin Time: 15.6 seconds — ABNORMAL HIGH (ref 11.4–15.2)

## 2020-08-11 LAB — PHOSPHORUS: Phosphorus: 5.2 mg/dL — ABNORMAL HIGH (ref 2.5–4.6)

## 2020-08-11 LAB — PREALBUMIN: Prealbumin: 25.6 mg/dL (ref 18–38)

## 2020-08-11 LAB — CK: Total CK: 79 U/L (ref 49–397)

## 2020-08-11 LAB — TROPONIN I (HIGH SENSITIVITY): Troponin I (High Sensitivity): 130 ng/L (ref <18)

## 2020-08-11 LAB — PROCALCITONIN: Procalcitonin: 0.16 ng/mL

## 2020-08-11 MED ORDER — BUDESONIDE 0.25 MG/2ML IN SUSP
0.2500 mg | Freq: Two times a day (BID) | RESPIRATORY_TRACT | Status: DC
  Administered 2020-08-11 – 2020-08-12 (×3): 0.25 mg via RESPIRATORY_TRACT
  Filled 2020-08-11 (×5): qty 2

## 2020-08-11 MED ORDER — GUAIFENESIN 100 MG/5ML PO SOLN
10.0000 mL | Freq: Two times a day (BID) | ORAL | Status: DC
  Administered 2020-08-11 (×2): 200 mg via ORAL
  Filled 2020-08-11 (×3): qty 10

## 2020-08-11 MED ORDER — FUROSEMIDE 10 MG/ML IJ SOLN
40.0000 mg | Freq: Two times a day (BID) | INTRAMUSCULAR | Status: DC
  Administered 2020-08-11 (×2): 40 mg via INTRAVENOUS
  Filled 2020-08-11 (×2): qty 4

## 2020-08-11 NOTE — ED Notes (Signed)
Breakfast Order Placed ?

## 2020-08-11 NOTE — ED Notes (Signed)
MD Doutova to bedside to assess pt based on respiratory status

## 2020-08-11 NOTE — Progress Notes (Signed)
ANTICOAGULATION CONSULT NOTE - Follow Up Consult  Pharmacy Consult for IV Heparin Indication: chest pain/ACS, atrial fibrillation  No Known Allergies  Patient Measurements: Height: 5\' 9"  (175.3 cm) Weight: 65.8 kg (145 lb 1 oz) IBW/kg (Calculated) : 70.7 Heparin Dosing Weight: 65.8 kg  Vital Signs: Temp: 98.2 F (36.8 C) (07/26 2031) Temp Source: Axillary (07/26 2031) BP: 128/70 (07/26 2031) Pulse Rate: 93 (07/26 2031)  Labs: Recent Labs    08/09/20 1900 08/09/20 1900 07/21/2020 1507 08/09/2020 1623 08/11/2020 1632 07/27/2020 1850 08/15/2020 2024 08/13/2020 2308 08/11/20 0053 08/11/20 0407 08/11/20 0700 08/11/20 1951  HGB 14.1  13.9  --   --  14.3 15.3  --   --   --   --  13.5  --   --   HCT 44.8  44.3  --   --  47.1 45.0  --   --   --   --  45.3  --   --   PLT 97*  98*  --   --  89*  --   --   --   --   --  76*  --   --   APTT  --   --   --   --   --   --   --  29  --   --  115* 118*  LABPROT  --   --   --   --   --   --   --  15.6*  --   --   --   --   INR  --   --   --   --   --   --   --  1.2  --   --   --   --   HEPARINUNFRC  --   --   --   --   --   --   --  >1.10*  --   --  >1.10* >1.10*  CREATININE  --   --  1.72*  --   --   --  1.73*  --   --  1.73*  --   --   CKTOTAL  --   --   --   --   --   --   --   --  79  --   --   --   TROPONINIHS  --    < > 134* 169*  --  156*  --   --  130*  --   --   --    < > = values in this interval not displayed.    Estimated Creatinine Clearance: 35.9 mL/min (A) (by C-G formula based on SCr of 1.73 mg/dL (H)).  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 following lung biopsy   Radiation 4/21,4/23,4/27,4/29,5/1   Right upper lobe of lung     Assessment: 75 yr old man presented with chest pain and shortness of breath. Pharmacy was consulted to dose IV heparin for ACS.   Patient was taking apixaban for atrial fibrillation PTA (last dose was at 0900 on 7/24). Given recent apixaban exposure, will monitor anticoagulation using aPTT until aPTT  and heparin levels correlate.  aPTT and heparin level drawn ~8 hrs after heparin infusion was decreased to 700 units/hr were 118 sec (above desired goal range) and >1.10 units/ml, indicating that apixaban is still influencing heparin level. H/H 13.5/45.3; platelets 89 > 76 this AM, no bleeding noted per RN; will continue to monitor. Per RN, no issues with IV or bleeding observed.  Goal of Therapy:  Heparin level 0.3-0.7 units/ml aPTT 66-102 seconds Monitor platelets by anticoagulation protocol: Yes   Plan:  Decrease heparin infusion to 600 units/hr Check aPTT, heparin level in 8 hrs Monitor daily aPTT, heparin level, CBC Monitor for bleeding  Gillermina Hu, PharmD, BCPS, [facility] Clinical Pharmacist

## 2020-08-11 NOTE — Progress Notes (Signed)
Called to the bedside for RR of 40. Pt was on [place] with sats of 96%.  He was given a respiratory treatment and placed on BiPAP by RT.  RR is now 29.  Saturation goals are 92%.  He is awake and alert, appropriate.  Will check ABG in 1 hour .  Magdalen Spatz, MSN, AGACNP-BC [place] for personal pager PCCM on call pager [phone]  08/11/2020 3:04 PM

## 2020-08-11 NOTE — Progress Notes (Signed)
ANTICOAGULATION CONSULT NOTE - Follow Up Consult  Pharmacy Consult for Heparin Indication: chest pain/ACS  No Known Allergies  Patient Measurements: Height: 5\' 9"  (175.3 cm) Weight: 65.8 kg (145 lb) IBW/kg (Calculated) : 70.7 Heparin Dosing Weight: 65.8kg  Vital Signs: Temp: 97.6 F (36.4 C) (07/26 0745) Temp Source: Axillary (07/26 0745) BP: 157/85 (07/26 1120) Pulse Rate: 95 (07/26 1120)  Labs: Recent Labs    08/09/20 1900 08/09/20 1900 08/16/2020 1507 08/14/2020 1623 07/20/2020 1632 08/02/2020 1850 07/22/2020 2024 08/13/2020 2308 08/11/20 0053 08/11/20 0407 08/11/20 0700  HGB 14.1  13.9  --   --  14.3 15.3  --   --   --   --  13.5  --   HCT 44.8  44.3  --   --  47.1 45.0  --   --   --   --  45.3  --   PLT 97*  98*  --   --  89*  --   --   --   --   --  76*  --   APTT  --   --   --   --   --   --   --  29  --   --  115*  LABPROT  --   --   --   --   --   --   --  15.6*  --   --   --   INR  --   --   --   --   --   --   --  1.2  --   --   --   HEPARINUNFRC  --   --   --   --   --   --   --  >1.10*  --   --  >1.10*  CREATININE  --   --  1.72*  --   --   --  1.73*  --   --  1.73*  --   CKTOTAL  --   --   --   --   --   --   --   --  79  --   --   TROPONINIHS  --    < > 134* 169*  --  156*  --   --  130*  --   --    < > = values in this interval not displayed.     Estimated Creatinine Clearance: 35.9 mL/min (A) (by C-G formula based on SCr of 1.73 mg/dL (H)).   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 following lung biopsy   Radiation 4/21,4/23,4/27,4/29,5/1   Right upper lobe of lung    Medications:  (Not in a hospital admission) Scheduled:   aspirin EC  81 mg Oral Daily    atorvastatin  20 mg Oral Daily   budesonide (PULMICORT) nebulizer solution  0.25 mg Nebulization BID   diltiazem  180 mg Oral Daily   furosemide  40 mg Intravenous BID   guaiFENesin  10 mL Oral Q12H   ipratropium-albuterol  3 mL Nebulization Q6H   methylPREDNISolone (SOLU-MEDROL) injection  40 mg Intravenous Q12H   sodium chloride  flush  3 mL Intravenous Q12H   sodium zirconium cyclosilicate  10 g Oral Daily   sodium zirconium cyclosilicate  10 g Oral Once   valACYclovir  500 mg Oral BID   Infusions:   sodium chloride     azithromycin     cefTRIAXone (ROCEPHIN)  IV     heparin 800 Units/hr (08/13/2020 2302)    Assessment: 12 yom presenting with chest pain and shortness of breath. Heparin consult per pharmacy ordered.  Patient taking apixaban prior to arrival for AF. Last dose 7/24 0900. Patient may have been initiated on heparin infusion while at [facility]; however, patient has been here since 1304 without heparin infusion.  PLT 89 > 76 this AM, no bleeding noted per RN. Will continue to monitor.   aPTT returned supratherapeutic at 115 secs.   Goal of Therapy:  Heparin level 0.3-0.7 units/ml aPTT 66-102 seconds Monitor platelets by anticoagulation protocol: Yes   Plan:  Decrease heparin infusion to 700 units/hr Check anti-Xa and aPTT level in 8 hours and daily while on heparin] Continue to check anti-xa levels and aPTT levels until correlated. Continue to monitor H&H and platelets  Joetta Manners, PharmD, [facility] Emergency Medicine Clinical Pharmacist ED RPh Phone: [place]: [phone]

## 2020-08-11 NOTE — Progress Notes (Signed)
Physical Therapy Evaluation Patient Details Name: Andrew Hodges MRN: [id number] DOB: 07/17/1945 Today's Date: 08/11/2020   History of Present Illness  Pt is a 75yo male presenting to [facility] ED from [place] on 7/25 with complaints of SOB, chest pain, and cough. Found to have HCAP. X-ray findings included atelectasis of R lung basilar segment. PMH: COPD, CAD, HTN, hyperkalemia, lung cancer, colon cancer, OSA w/CPAP.  Clinical Impression  Pt presents with the problems listed above and impairments below. Pt required mod assist +2 for bed mobility. When seated at EOB, pt developed increased SOB with use of accessory musculature for breathing (DOE 3-4/4) so was returned to supine. Required increased time for recovery. O2 sats remained at or above 94% for the duration of the session on 5L. Anticipate return to [facility] in order to continue recovery; however, if pt does not return will benefit from SNF-level therapies. Pt will continue to benefit from acute skilled therapy to promote independence with functional mobility.    Follow Up Recommendations LTACH (vs SNF if doesn't go back to [facility])    Equipment Recommendations  None recommended by PT    Recommendations for Other Services       Precautions / Restrictions Precautions Precautions: Fall Restrictions Weight Bearing Restrictions: No      Mobility  Bed Mobility Overal bed mobility: Needs Assistance Bed Mobility: Supine to Sit;Sit to Supine     Supine to sit: Mod assist;+2 for physical assistance Sit to supine: Mod assist;+2 for physical assistance   General bed mobility comments: Pt requires mod assist +2 for for LE movements as well as trunk control and scooting to EOB via pad. Pt at EOB developed increased work of breathing (DOE 3-4/4) and reliance on accessory musculature. Returned to supine and coached through pursed lip breathing techniques to slow respirations. Pt requiring extended time to recover.    Transfers                  General transfer comment: Pt did not perform transfers secondary to increased work of breathing  Ambulation/Gait                Stairs            Wheelchair Mobility    Modified Rankin (Stroke Patients Only)       Balance Overall balance assessment: Needs assistance Sitting-balance support: Bilateral upper extremity supported Sitting balance-Leahy Scale: Poor Sitting balance - Comments: Pt started without support but then transitioned to bilateral UE support with increased time at EOB                                     Pertinent Vitals/Pain Pain Assessment: Faces Faces Pain Scale: Hurts a little bit Pain Location: bilateral feet Pain Descriptors / Indicators: Tender;Sore Pain Intervention(s): Monitored during session;Repositioned    Home Living Family/patient expects to be discharged to:: Other (Comment)                 Additional Comments: [facility]    Prior Function Level of Independence: Needs assistance   Gait / Transfers Assistance Needed: Therapy team at [facility] would assist with ambulation and transfers such as bed to chair and short distance ambulation with RW.  ADL's / Homemaking Assistance Needed: team at [facility] would provide assistance with bathing and dressing        Hand Dominance  Extremity/Trunk Assessment   Upper Extremity Assessment Upper Extremity Assessment: Generalized weakness;RUE deficits/detail;LUE deficits/detail RUE Deficits / Details: Grip strength equal, 3+/5 grossly throughout RUE. Pt has intention tremor noted when he was reaching for cup of water and using fork with lunch; required food to be cut up and cup to be steadied by PT LUE Deficits / Details: Grip strength equal, 3+/5 grossly throughout LUE    Lower Extremity Assessment Lower Extremity Assessment: Generalized weakness;RLE deficits/detail;LLE deficits/detail RLE Deficits / Details: limited hip flexion;  was able to perform partial heel slide. Bilateral pitting edema at ankles RLE Sensation: decreased light touch LLE Deficits / Details: limited hip flexion; was able to perform partial heel slide. Bilateral pitting edema at ankles LLE Sensation: decreased light touch    Cervical / Trunk Assessment Cervical / Trunk Assessment: Kyphotic  Communication   Communication: No difficulties  Cognition Arousal/Alertness: Awake/alert Behavior During Therapy: WFL for tasks assessed/performed Overall Cognitive Status: Within Functional Limits for tasks assessed                                        General Comments General comments (skin integrity, edema, etc.): Oxygen sats >90% on 5L throughout. DOE at [place] with mobility tasks.    Exercises     Assessment/Plan    PT Assessment Patient needs continued PT services  PT Problem List Decreased strength;Decreased activity tolerance;Decreased range of motion;Decreased balance;Decreased mobility;Cardiopulmonary status limiting activity;Impaired sensation       PT Treatment Interventions Functional mobility training;Therapeutic activities;Therapeutic exercise;Balance training;Patient/family education;Gait training;DME instruction    PT Goals (Current goals can be found in the Care Plan section)  Acute Rehab PT Goals Patient Stated Goal: to get back home PT Goal Formulation: With patient Time For Goal Achievement: 08/25/20 Potential to Achieve Goals: Fair    Frequency Min 2X/week   Barriers to discharge        Co-evaluation               AM-PAC PT "6 Clicks" Mobility  Outcome Measure Help needed turning from your back to your side while in a flat bed without using bedrails?: A Little Help needed moving from lying on your back to sitting on the side of a flat bed without using bedrails?: Total Help needed moving to and from a bed to a chair (including a wheelchair)?: Total Help needed standing up from a chair using  your arms (e.g., wheelchair or bedside chair)?: Total Help needed to walk in hospital room?: Total Help needed climbing 3-5 steps with a railing? : Total 6 Click Score: 8    End of Session Equipment Utilized During Treatment: Oxygen Activity Tolerance: Treatment limited secondary to medical complications (Comment);Patient limited by fatigue (Increased SOB) Patient left: in bed;with call bell/phone within reach (on stretcher in ED; Placed rolled sheets under pt's heels to prevent heel sores) Nurse Communication: Mobility status PT Visit Diagnosis: Other abnormalities of gait and mobility (R26.89);Muscle weakness (generalized) (M62.81);Difficulty in walking, not elsewhere classified (R26.2)    Time: 4235-3614 PT Time Calculation (min) (ACUTE ONLY): 24 min   Charges:   PT Evaluation $PT Eval Moderate Complexity: 1 Mod PT Treatments $Therapeutic Activity: 8-22 mins        Dawayne Cirri, SPT   Dawayne Cirri 08/11/2020, 3:01 PM

## 2020-08-11 NOTE — Progress Notes (Signed)
  Echocardiogram 2D Echocardiogram has been performed.  Andrew Hodges 08/11/2020, 9:10 AM

## 2020-08-11 NOTE — Consult Note (Addendum)
NAME:  Andrew Hodges, MRN:  [id number], DOB:  1945-05-09, LOS: 1 ADMISSION DATE:  08/09/2020, CONSULTATION DATE:  08/11/2020 REFERRING MD: Roel Cluck , CHIEF COMPLAINT:   COPD exacerbation/ OSA  History of Present Illness:  75 y.o. male former smoker ( Quit 2012 with a 45 pack year smoking history) with medical history significant of [place] cancer, Lung CA ,DVT (on Eliquis), HTN,HLD,severe COPD on home O2, Emphysema, sleep apnea, non-compliant with CPAP, atrial fibrillation on apixaban, and CHF  Pt. Presented to the ED 7/25 with shortness of breath and intermittent chest pain x  1 week. ( Pain scale 8-10) increasing oxygen requirement from baseline 5 L to 8 L and increased work of breathing. Pt had a recent admission at OSH for COPD exacerbation, hypercarbia. He was treated with BiPAP , antibiotics, steroids , BD nebs and was transitioned to [place]. He was transitioned to [facility] 08/05/2020 where he has been, until yesterday ( 7/25) when he was transferred to [facility] for chest pain and elevated troponins. EKG was negative for STEMI  In the ED Chest x ray showed Right lung base atelectasis vs infiltrate, and a 14 mm nodular density in the right suprahilar region. WBC was 13, mildly elevated lactate at 2.1, now 1.3. BC pending , Troponin 134, and 169, 156,130.   Venous Gas was 7.37/69.8/45/42/40.4/77% sat  Pt is net negative 650 cc's , 650 cc UO last 24 hours  Pt. Was admitted by [facility] , PCCM have been consulted to assist with care for acute on chronic respiratory failure 2/2 COPD exacerbation / HCAP/and hypercarbia.   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 following lung biopsy   Radiation  4/21,4/23,4/27,4/29,5/1   Right upper lobe of lung     Significant Hospital Events: Including procedures, antibiotic start and stop dates in addition to other pertinent events   7/25 admission to [facility]  7/20 Admission to [facility] from OSH  Anti-infective agents Azithromycin 7/25>> Rocephin 7/25>> Valtrex 7/25>>  Cultures 08/13/2020 Blood  08/07/2020 Sars Covid >> Negative 07/24/2020>>  Influenza A by PCR NEGATIVE NEGATIVE   Influenza B by PCR NEGATIVE NEGATIVE    Interim History / Subjective:  Pt currently on 4 L [place] with sats of 96%. RR is 29. He is able to speak, but not in complete sentences. States his breathing is a little better.  T Max is 98.9, Troponin 130, WBC 10.5,  Platelets are 76,000, HGB 13.5, K 5.3, Na 150, creatinine 1.73, CK 79 Pro calcitonin 0.16   Objective   Blood pressure (!) 165/82, pulse 82, temperature 97.6 F (36.4 C), temperature source Axillary, resp. rate (!) 25, height 5\' 9"  (1.753 m), weight 65.8 kg, SpO2 95 %.        Intake/Output Summary (Last 24 hours) at 08/11/2020 0845 Last data filed at 08/11/2020 0806 Gross per 24 hour  Intake --  Output 650 ml  Net -650 ml   Filed Weights   08/07/2020 2051  Weight: 65.8 kg    Examination: General: Awake and alert, on 4 L [place], mild increased WOB HENT: NCAT, MMP dry,No LAD, Trace JVD, poor dentition Lungs: Bilateral chest  excursion, Coarse rhonchi throughout, diminished per bases, few crackles per base, no wheeze noted, Cardiovascular: NSR, S1, S2, No MRG Abdomen: Soft, ND, NT BS +, Body mass index is 21.41 kg/m. Extremities: 2+  LE edema, brisk capillary refill, no obvious deformities Neuro: Awake and alert, able to tell me the name of his sleep doctor, MAE x 4 GU: Not assessed  Resolved Hospital Problem list     Assessment & Plan:  Acute on chronic respiratory failure 2/2 HCAP/ COPD exacerbation/ CHF Home oxygen use 5 L, 24/7 Plan Titrate oxygen for sats > 92% Continue azithromycin, Rocephin,  Valtrex per primary team  Continue Solumedrol, start wean tomorrow if no bronchospasm ABG prn CXR in am and prn Minimize sedation/ pain medications  Continue Duo Nebs Add Pulmicort Nebs Hold Dulera as patient most likely not getting benefit with low tidal volumes with tachypnea. Follow micro, de-escalate ABX as able Trend fever curve and WBC Culture as is clinically indicated Swallow eval Aggressive pulmonary Toilet Flutter valve Q 1 while awake IS Q 1 while awake PT/ OT/ OOB to chair Trend Mag, Goal > 2   OSA on CPAP Home Setting is fixed pressure 18 cm H2O Poor compliance with CPAP at home  Uses less than 4 hours and about 1 night per 3 months ( Per [place] Dr. Dr. Alcide Clever, [facility].) Plan Start CPAP with oxygen bleed in at bedtime Set Pressure of 18 cm H2O ( Home setting) If unable to tolerate , switch to Auto Set 8-20 cm H2O. Goal is to wear > 4 hours each night.  Continue intermittent BiPAP as needed for increased somnolence/ AMS  Consider OP referral for Evaluation for Inspire Device as patient is non-compliant with CPAP BMI is < 32 and he has failed CPAP therapy based on non-compliance.   Pulmonary Nodule per CXR Former Lung cancer  Former smoker with 45 pack year smoking Hx Plan Will need CT Chest without follow up as OP  CHF Plan Echo>> pending  Goal is euvolemia Diuresis per primary team  Trend CXR  All other problems per Primary Team. We will continue to Follow   Best Practice (right click and "Reselect all SmartList Selections" daily)   Diet/type: NPO w/ oral meds DVT prophylaxis: systemic heparin GI prophylaxis: N/A Lines: N/A Foley:  N/A Code Status:  full code Last date of multidisciplinary goals of care discussion [Pending per primary Team]  Labs   CBC: Recent Labs  Lab 08/09/20 1900 08/02/2020 1623 07/27/2020 1632 08/11/20 0407  WBC 15.3*  14.8* 13.6*  --  10.5  NEUTROABS 14.2* 12.6*  --  10.1*  HGB 14.1   13.9 14.3 15.3 13.5  HCT 44.8  44.3 47.1 45.0 45.3  MCV 93.3  93.9 94.8  --  94.8  PLT 97*  98* 89*  --  76*    Basic Metabolic Panel: Recent Labs  Lab 08/02/2020 1507 08/16/2020 1632 07/26/2020 2024 08/11/20 0407  NA 148* 146* 150* 148*  K 5.7* 5.4* 5.6* 5.3*  CL 106  --  104 102  CO2 33*  --  37* 39*  GLUCOSE 124*  --  97 101*  BUN 73*  --  70* 68*  CREATININE 1.72*  --  1.73* 1.73*  CALCIUM 9.2  --  8.9 9.0  MG  --   --   --  2.2  PHOS  --   --   --  5.2*   GFR: Estimated Creatinine Clearance: 35.9 mL/min (A) (by  C-G formula based on SCr of 1.73 mg/dL (H)). Recent Labs  Lab 08/09/20 1900 08/16/2020 1623 07/22/2020 1636 08/05/2020 1850 08/11/20 0407  PROCALCITON  --   --   --  0.14 0.16  WBC 15.3*  14.8* 13.6*  --   --  10.5  LATICACIDVEN  --   --  2.1* 1.3  --     Liver Function Tests: Recent Labs  Lab 08/08/2020 1507 08/11/20 0407  AST 29 28  ALT 33 30  ALKPHOS 45 42  BILITOT 0.6 0.6  PROT 5.7* 5.4*  ALBUMIN 3.1* 2.8*   No results for input(s): LIPASE, AMYLASE in the last 168 hours. No results for input(s): AMMONIA in the last 168 hours.  ABG    Component Value Date/Time   HCO3 40.4 (H) 08/13/2020 1632   TCO2 42 (H) 08/07/2020 1632   O2SAT 77.0 08/14/2020 1632     Coagulation Profile: Recent Labs  Lab 07/28/2020 2308  INR 1.2    Cardiac Enzymes: Recent Labs  Lab 08/11/20 0053  CKTOTAL 79    HbA1C: No results found for: HGBA1C  CBG: Recent Labs  Lab 07/27/2020 2247  GLUCAP 94    Review of Systems:   + For Shortness of breath, Cough, intermittent Chest pain,Lower extremity edema, Pain per lower extremities, fatigue  Past Medical History:  He,  has a past medical history of Atherosclerosis, CHF (congestive heart failure) ([place]), Depression, Emphysema lung ([place]), Hyperlipidemia, Hypertension, Lung cancer ([place]) (04/01/13), Osteoarthritis, Pneumothorax (03/2013), and Radiation (4/21,4/23,4/27,4/29,5/1).   Surgical History:   Past Surgical  History:  Procedure Laterality Date   COLON SURGERY     EXCISIONAL HEMORRHOIDECTOMY     LUMBAR LAMINECTOMY     many years ago   LUNG BIOPSY  04/01/2013   RUL- adenocarcinoma     Social History:   reports that he quit smoking about 10 years ago. His smoking use included cigarettes. He has a 45.00 pack-year smoking history. He has never used smokeless tobacco. He reports that he does not drink alcohol and does not use drugs.   Family History:  His family history includes Cancer in his brother and brother; Diabetes in his father and mother; Heart disease in his father and mother; Hypertension in his mother; Stroke in his father.   Allergies No Known Allergies   Home Medications  Prior to Admission medications   Medication Sig Start Date End Date Taking? Authorizing Provider  allopurinol (ZYLOPRIM) 100 MG tablet Take 100 mg by mouth daily. 04/03/20  Yes [provider]  ALPRAZolam Duanne Moron) 1 MG tablet Take 1 mg by mouth 3 (three) times daily as needed for anxiety.   Yes [provider]  cetirizine (ZYRTEC) 10 MG tablet Take 10 mg by mouth daily.   Yes [provider]  dexamethasone (DECADRON) 2 MG tablet Take 2 mg by mouth daily. X 3 days 08/01/2020  Yes [provider]  diltiazem (CARDIZEM CD) 180 MG 24 hr capsule Take 180 mg by mouth daily. 05/19/20  Yes [provider]  ELIQUIS 5 MG TABS tablet Take 5 mg by mouth 2 (two) times daily. 07/21/20  Yes [provider]  Lactobacillus TABS Take 1 tablet by mouth daily.   Yes [provider]  losartan (COZAAR) 100 MG tablet Take 100 mg by mouth daily.   Yes [provider]  Multiple Vitamin (MULTIVITAMIN) tablet Take 1 tablet by mouth daily.   Yes [provider]  Nutritional Supplements (ENSURE ENLIVE PO) Take 237 mLs by  mouth daily.   Yes [provider]  oxyCODONE (OXY IR/ROXICODONE) 5 MG immediate release tablet Take 5 mg by mouth every 6 (six) hours as needed  for severe pain. 04/28/13  Yes [provider]  pantoprazole (PROTONIX) 40 MG tablet Take 40 mg by mouth daily.   Yes [provider]  potassium chloride (KLOR-CON) 10 MEQ tablet Take 10 mEq by mouth daily. 06/05/20  Yes [provider]  prednisoLONE acetate (PRED FORTE) 1 % ophthalmic suspension Place 1 drop into the right eye every other day. 03/18/20  Yes [provider]  valACYclovir (VALTREX) 500 MG tablet Take 500 mg by mouth 2 (two) times daily. 04/15/20  Yes [provider]  albuterol (PROVENTIL HFA;VENTOLIN HFA) 108 (90 BASE) MCG/ACT inhaler Inhale into the lungs every 6 (six) hours as needed for wheezing or shortness of breath.    [provider]  aspirin 325 MG tablet Take 325 mg by mouth daily.    [provider]  budesonide-formoterol (SYMBICORT) 160-4.5 MCG/ACT inhaler Inhale 2 puffs into the lungs 2 (two) times daily.    [provider]  fluticasone (FLONASE) 50 MCG/ACT nasal spray Place into both nostrils 2 (two) times daily.    [provider]  roflumilast (DALIRESP) 500 MCG TABS tablet Take 500 mcg by mouth daily.    [provider]     Critical care time: 63 minutes    Magdalen Spatz, MSN, AGACNP-BC [place] for personal pager PCCM on call pager [phone] >> Hospital use only  08/11/2020 10:12 AM

## 2020-08-11 NOTE — Progress Notes (Signed)
RT called during report to assessed pt for increased RR and WOB. RT assessed pt and placed pt back onto BiPAP due to increased WOB, RR of 45+, desat into the low 80s. PT is now resting comfortably sat 92% and RR of 30. RT will cont to monitor.

## 2020-08-11 NOTE — ED Notes (Signed)
Pt placed on BiPAP, soiled brief changed, and secured urinary device to collect urine

## 2020-08-11 NOTE — Progress Notes (Signed)
Pt placed back on bipap due to an increased WOB and RR per NP. NP would like ABG in 1hr.

## 2020-08-11 NOTE — ED Notes (Signed)
Currently on 4L[place]. Off the bipap.

## 2020-08-11 NOTE — Consult Note (Signed)
Cardiology Consultation:   Patient ID: Andrew Hodges MRN: [id number]; DOB: 08/23/1945  Admit date: 07/17/2020 Date of Consult: 08/11/2020  PCP:  Pcp, No   [facility] Providers Cardiologist:  None new  Patient Profile:   Andrew Hodges is a 75 y.o. male with a hx of lung CA rx w/ XRT, colon CA s/p colectomy, HTN, HLD, DVT, IDA anemia, COPD on O2 at 5 lpm, Coronary atherosclerosis on CT, OSA on CPAP, who is being seen 08/11/2020 for the evaluation of elevated troponin at the request of Dr Lupita Leash.  History of Present Illness:   Andrew Hodges was at [place] prior to admission. He reports that he has been oob twice since admission. O2 is chronically at 5 lpm.  He feels his breathing has been this bad for a long time. He did not have chest pain till last pm.  His breathing is very bad right now, using all accessory muscles and his respiratory rate is greater than 40.  The pain was in the middle of his chest and reached an 8/10.  He did not take anything specifically for the pain.  He came to the hospital and does not remember being given anything specifically for the pain here.  However, it has gradually eased off and has resolved.  He had not been having this pain prior to admission.  He does not remember having any fevers or chills.  Level is very poor, but he does not remember any history of having any chest pain with exertion.    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 following lung biopsy   Radiation 4/21,4/23,4/27,4/29,5/1   Right upper lobe of lung    Past Surgical History:  Procedure Laterality Date   COLON SURGERY     EXCISIONAL HEMORRHOIDECTOMY     LUMBAR LAMINECTOMY     many years ago   LUNG  BIOPSY  04/01/2013   RUL- adenocarcinoma     Home Medications:  Prior to Admission medications   Medication Sig Start Date End Date Taking? Authorizing Provider  albuterol (PROVENTIL HFA;VENTOLIN HFA) 108 (90 BASE) MCG/ACT inhaler Inhale into the lungs every 6 (six) hours as needed for wheezing or shortness of breath.   Yes [provider]  allopurinol (ZYLOPRIM) 100 MG tablet Take 100 mg by mouth daily. 04/03/20  Yes [provider]  ALPRAZolam Duanne Moron) 1 MG tablet Take 1 mg by mouth 3 (three) times daily as needed for anxiety.   Yes [provider]  atorvastatin (LIPITOR) 20 MG tablet Take 20 mg by mouth at bedtime.   Yes [provider]  benzonatate (TESSALON) 100 MG capsule Take 100 mg by mouth 3 (three) times daily as needed for cough.   Yes [provider]  budesonide (PULMICORT) 0.5 MG/2ML nebulizer solution Take 0.5 mg by nebulization 2 (two) times daily.   Yes [provider]  cetirizine (ZYRTEC) 10 MG tablet Take 10 mg by mouth daily.   Yes [provider]  dexamethasone (DECADRON) 2 MG tablet Take 2 mg by mouth daily. X 3 days 07/21/2020  Yes [provider]  diltiazem (CARDIZEM CD) 180 MG 24 hr  capsule Take 180 mg by mouth daily. 05/19/20  Yes [provider]  ELIQUIS 5 MG TABS tablet Take 5 mg by mouth 2 (two) times daily. 07/21/20  Yes [provider]  furosemide (LASIX) 20 MG tablet Take 20 mg by mouth 2 (two) times daily.   Yes [provider]  guaiFENesin (ROBITUSSIN) 100 MG/5ML liquid Take 200 mg by mouth 4 (four) times daily as needed for cough or congestion.   Yes [provider]  ipratropium-albuterol (DUONEB) 0.5-2.5 (3) MG/3ML SOLN Take 3 mLs by nebulization every 6 (six) hours as needed (wheezing, Shortness of breath).   Yes [provider]  losartan (COZAAR) 100 MG tablet Take 100 mg by mouth daily.   Yes [provider]  montelukast (SINGULAIR) 10 MG  tablet Take 10 mg by mouth at bedtime.   Yes [provider]  Multiple Vitamin (MULTIVITAMIN) tablet Take 1 tablet by mouth daily.   Yes [provider]  Nutritional Supplements (ENSURE ENLIVE PO) Take 237 mLs by mouth daily.   Yes [provider]  oxyCODONE (OXY IR/ROXICODONE) 5 MG immediate release tablet Take 5 mg by mouth every 6 (six) hours as needed for severe pain. 04/28/13  Yes [provider]  pantoprazole (PROTONIX) 40 MG tablet Take 40 mg by mouth daily.   Yes [provider]  polyvinyl alcohol (LIQUIFILM TEARS) 1.4 % ophthalmic solution Place 1 drop into both eyes every 6 (six) hours as needed for dry eyes.   Yes [provider]  potassium chloride (KLOR-CON) 10 MEQ tablet Take 10 mEq by mouth daily. 06/05/20  Yes [provider]  prednisoLONE acetate (PRED FORTE) 1 % ophthalmic suspension Place 1 drop into the right eye every other day. 03/18/20  Yes [provider]  valACYclovir (VALTREX) 500 MG tablet Take 500 mg by mouth 2 (two) times daily. 04/15/20  Yes [provider]  budesonide-formoterol (SYMBICORT) 160-4.5 MCG/ACT inhaler Inhale 2 puffs into the lungs 2 (two) times daily. Patient not taking: No sig reported    [provider]  fluticasone (FLONASE) 50 MCG/ACT nasal spray Place into both nostrils 2 (two) times daily. Patient not taking: No sig reported    [provider]  Lactobacillus TABS Take 1 tablet by mouth daily.    [provider]  roflumilast (DALIRESP) 500 MCG TABS tablet Take 500 mcg by mouth daily.    [provider]    Inpatient Medications: Scheduled Meds:  aspirin EC  81 mg Oral Daily   atorvastatin  20 mg Oral Daily   budesonide (PULMICORT) nebulizer solution  0.25 mg Nebulization BID   diltiazem  180 mg Oral Daily   furosemide  40 mg Intravenous BID   guaiFENesin  10 mL Oral Q12H   ipratropium-albuterol  3 mL Nebulization Q6H    methylPREDNISolone (SOLU-MEDROL) injection  40 mg Intravenous Q12H   sodium chloride flush  3 mL Intravenous Q12H   sodium zirconium cyclosilicate  10 g Oral Daily   sodium zirconium cyclosilicate  10 g Oral Once   valACYclovir  500 mg Oral BID   Continuous Infusions:  sodium chloride     azithromycin     cefTRIAXone (ROCEPHIN)  IV     heparin 700 Units/hr (08/11/20 1147)   PRN Meds: sodium chloride, acetaminophen **OR** acetaminophen, albuterol, ALPRAZolam, oxyCODONE, sodium chloride flush  Allergies:   No Known Allergies  Social History:   Social History   Socioeconomic History   Marital status: Single    Spouse  name: Not on file   Number of children: Not on file   Years of education: Not on file   Highest education level: Not on file  Occupational History   Not on file  Tobacco Use   Smoking status: Former    Packs/day: 1.00    Years: 45.00    Pack years: 45.00    Types: Cigarettes    Quit date: 04/20/2010    Years since quitting: 10.3   Smokeless tobacco: Never  Substance and Sexual Activity   Alcohol use: No    Comment: hx of daily beer   Drug use: No   Sexual activity: Not on file  Other Topics Concern   Not on file  Social History Narrative   Not on file   Social Determinants of Health   Financial Resource Strain: Not on file  Food Insecurity: Not on file  Transportation Needs: Not on file  Physical Activity: Not on file  Stress: Not on file  Social Connections: Not on file  Intimate Partner Violence: Not on file    Family History:    Family History  Problem Relation Age of Onset   Heart disease Mother    Hypertension Mother    Diabetes Mother    Stroke Father    Heart disease Father    Diabetes Father    Cancer Brother        stomach   Cancer Brother        liver     ROS:  Please see the history of present illness.  All other ROS reviewed and negative.     Physical Exam/Data:   Vitals:   08/11/20 1215 08/11/20 1300 08/11/20 1345  08/11/20 1430  BP: (!) 165/80 (!) 169/83 (!) 159/79 (!) 145/79  Pulse: 95 (!) 101 (!) 104 98  Resp: (!) 25 (!) 26 (!) 33 (!) 28  Temp:      TempSrc:      SpO2: 98% 98% 92% 96%  Weight:      Height:        Intake/Output Summary (Last 24 hours) at 08/11/2020 1504 Last data filed at 08/11/2020 0806 Gross per 24 hour  Intake --  Output 650 ml  Net -650 ml   Last 3 Weights 08/06/2020 07/05/2013 05/07/2013  Weight (lbs) 145 lb 145 lb 12.8 oz 144 lb 8 oz  Weight (kg) 65.772 kg 66.134 kg 65.545 kg     Body mass index is 21.41 kg/m.  General:  Well nourished, well developed, in acute distress HEENT: normal Lymph: no adenopathy Neck: JVD 9 cm Endocrine:  No thryomegaly Vascular: No carotid bruits; 4/4 extremity pulses 1-2+ bilaterally Cardiac:  normal S1, S2; RRR; no murmur  Lungs: Significantly decreased air exchange with rales and wheezing bilaterally, no rhonchi  Abd: soft, nontender, no hepatomegaly  Ext: no edema Musculoskeletal:  No deformities, BUE and BLE strength weak but equal Skin: warm and dry  Neuro:  CNs 2-12 intact, no focal abnormalities noted Psych:  Normal affect   EKG:  The EKG was personally reviewed and demonstrates: 7/25 ECG is sinus rhythm, heart rate 93, Q waves V1-2, very small inferior R waves, no acute ischemic changes, no old to compare Telemetry:  Telemetry was personally reviewed and demonstrates:  Sinus rhythm, sinus tach, occasional PVCs  Relevant CV Studies:  ECHO: 08/11/2020  1. Left ventricular ejection fraction, by estimation, is 65 to 70%. The left ventricle has normal function. The left ventricle has no regional wall motion abnormalities. There  is moderate concentric left ventricular hypertrophy. Left ventricular diastolic parameters are consistent with Grade I diastolic dysfunction (impaired relaxation).   2. Right ventricular systolic function is normal. The right ventricular size is normal. There is normal pulmonary artery systolic pressure.    3. The mitral valve is normal in structure. No evidence of mitral valve regurgitation. No evidence of mitral stenosis.   4. The aortic valve is tricuspid. Aortic valve regurgitation is trivial.  No aortic stenosis is present.   5. The inferior vena cava is normal in size with greater than 50%  respiratory variability, suggesting right atrial pressure of 3 mmHg.   04/14/2016  Echocardiogram- 1. Technically difficult but adequate study 2. All chambers are normal in size 3. Normal left systolic function, EF 27-78% 4. Valves appear structurally normal . Trace mitral and tricuscpid regurgitation  5. No pericardial effusion  Laboratory Data:  High Sensitivity Troponin:   Recent Labs  Lab 08/11/2020 1507 08/14/2020 1623 07/28/2020 1850 08/11/20 0053  TROPONINIHS 134* 169* 156* 130*     Chemistry Recent Labs  Lab 07/18/2020 1507 07/30/2020 1632 07/18/2020 2024 08/11/20 0407  NA 148* 146* 150* 148*  K 5.7* 5.4* 5.6* 5.3*  CL 106  --  104 102  CO2 33*  --  37* 39*  GLUCOSE 124*  --  97 101*  BUN 73*  --  70* 68*  CREATININE 1.72*  --  1.73* 1.73*  CALCIUM 9.2  --  8.9 9.0  GFRNONAA 42*  --  41* 41*  ANIONGAP 9  --  9 7    Recent Labs  Lab 07/29/2020 1507 08/11/20 0407  PROT 5.7* 5.4*  ALBUMIN 3.1* 2.8*  AST 29 28  ALT 33 30  ALKPHOS 45 42  BILITOT 0.6 0.6   Hematology Recent Labs  Lab 08/09/20 1900 08/03/2020 1623 08/13/2020 1632 08/11/20 0407  WBC 15.3*  14.8* 13.6*  --  10.5  RBC 4.80  4.72 4.97  --  4.78  HGB 14.1  13.9 14.3 15.3 13.5  HCT 44.8  44.3 47.1 45.0 45.3  MCV 93.3  93.9 94.8  --  94.8  MCH 29.4  29.4 28.8  --  28.2  MCHC 31.5  31.4 30.4  --  29.8*  RDW 15.9*  15.8* 15.7*  --  15.6*  PLT 97*  98* 89*  --  76*   BNP Recent Labs  Lab 08/02/2020 1623  BNP 64.4    DDimer No results for input(s): DDIMER in the last 168 hours.  No results found for: CHOL, HDL, LDLCALC, LDLDIRECT, TRIG, CHOLHDL Lab Results  Component Value Date   TSH 1.330 08/11/2020    No results found for: HGBA1C   Radiology/Studies:  DG Chest 2 View  Result Date: 08/04/2020 CLINICAL DATA:  74 year old male with chest pain and shortness of breath. Cough. EXAM: CHEST - 2 VIEW COMPARISON:  None FINDINGS: Patchy area airspace density at the right lung base may represent atelectasis or infiltrate. Clinical correlation and follow-up to resolution recommended. Trace right pleural effusion may be present. A 14 mm nodular density in the right suprahilar region may represent an area of scarring or confluence of vasculature. Direct comparison with prior images, if available, recommended. If no prior images are available attention on close follow-up imaging or further evaluation with chest CT is recommended. The left lung is clear. No pneumothorax. Top-normal cardiac size. Atherosclerotic calcification of the aorta. No acute osseous pathology. IMPRESSION: 1. Right lung base atelectasis or infiltrate. Clinical correlation and follow-up  to resolution recommended. 2. A 14 mm nodular density in the right suprahilar region. Direct comparison with prior or follow-up imaging studies or further evaluation with chest CT on a nonemergent/outpatient basis recommended. Electronically Signed   By: Anner Crete M.D.   On: 07/19/2020 15:20   DG CHEST PORT 1 VIEW  Result Date: 08/11/2020 CLINICAL DATA:  Abnormal troponin levels. EXAM: PORTABLE CHEST 1 VIEW COMPARISON:  August 10, 2020 FINDINGS: Mild, stable atelectasis and/or infiltrate is seen within the right lung base. A stable 14 mm nodular opacity is seen within the suprahilar region on the right. Ill-defined surgical sutures are seen overlying the right upper lobe. There is no evidence of a pleural effusion or pneumothorax. A prominent curvilinear skin fold is suspected along the upper right lung. The heart size and mediastinal contours are within normal limits. There is mild calcification of the aortic arch. Degenerative changes seen throughout the  thoracic spine. IMPRESSION: 1. Mild, stable right basilar atelectasis and/or infiltrate. 2. 14 mm right suprahilar nodular opacity. Further evaluation with nonemergent chest CT is recommended to exclude the presence of an underlying neoplastic process. 3. Prominent skin fold overlying the upper right lung. Repeat chest plain film with improved patient positioning is recommended to further exclude the presence of an underlying pneumothorax. Electronically Signed   By: Virgina Norfolk M.D.   On: 08/11/2020 03:15   ECHOCARDIOGRAM COMPLETE  Result Date: 08/11/2020    ECHOCARDIOGRAM REPORT   Patient Name:   Andrew Hodges Date of Exam: 08/11/2020 Medical Rec #:  [id number]         Height:       69.0 in Accession #:    [id number]        Weight:       145.0 lb Date of Birth:  May 01, 1945         BSA:          1.802 m Patient Age:    48 years          BP:           165/82 mmHg Patient Gender: M                 HR:           91 bpm. Exam Location:  Inpatient Procedure: 2D Echo, 3D Echo, Cardiac Doppler and Color Doppler Indications:    R07.9* Chest pain, unspecified. Elevated troponin  History:        Patient has no prior history of Echocardiogram examinations.                 CAD, COPD, Signs/Symptoms:Shortness of Breath, Dyspnea and Chest                 Pain; Risk Factors:Hypertension and Dyslipidemia. Pneumonia.  Sonographer:    Roseanna Rainbow RDCS Referring Phys: [place]  Sonographer Comments: Technically difficult study due to poor echo windows. Image acquisition challenging due to COPD. Patient in pain, in fowler's position. IMPRESSIONS  1. Left ventricular ejection fraction, by estimation, is 65 to 70%. The left ventricle has normal function. The left ventricle has no regional wall motion abnormalities. There is moderate concentric left ventricular hypertrophy. Left ventricular diastolic parameters are consistent with Grade I diastolic dysfunction (impaired relaxation).  2. Right ventricular systolic  function is normal. The right ventricular size is normal. There is normal pulmonary artery systolic pressure.  3. The mitral valve is normal in structure. No evidence of mitral valve regurgitation.  No evidence of mitral stenosis.  4. The aortic valve is tricuspid. Aortic valve regurgitation is trivial. No aortic stenosis is present.  5. The inferior vena cava is normal in size with greater than 50% respiratory variability, suggesting right atrial pressure of 3 mmHg. FINDINGS  Left Ventricle: Left ventricular ejection fraction, by estimation, is 65 to 70%. The left ventricle has normal function. The left ventricle has no regional wall motion abnormalities. The left ventricular internal cavity size was normal in size. There is  moderate concentric left ventricular hypertrophy. Left ventricular diastolic parameters are consistent with Grade I diastolic dysfunction (impaired relaxation). Normal left ventricular filling pressure. Right Ventricle: The right ventricular size is normal. No increase in right ventricular wall thickness. Right ventricular systolic function is normal. There is normal pulmonary artery systolic pressure. The tricuspid regurgitant velocity is 2.13 m/s, and  with an assumed right atrial pressure of 3 mmHg, the estimated right ventricular systolic pressure is 29.9 mmHg. Left Atrium: Left atrial size was normal in size. Right Atrium: Right atrial size was normal in size. Pericardium: There is no evidence of pericardial effusion. Mitral Valve: The mitral valve is normal in structure. No evidence of mitral valve regurgitation. No evidence of mitral valve stenosis. Tricuspid Valve: The tricuspid valve is normal in structure. Tricuspid valve regurgitation is trivial. No evidence of tricuspid stenosis. Aortic Valve: The aortic valve is tricuspid. Aortic valve regurgitation is trivial. No aortic stenosis is present. Pulmonic Valve: The pulmonic valve was normal in structure. Pulmonic valve regurgitation is  not visualized. No evidence of pulmonic stenosis. Aorta: The aortic root is normal in size and structure. Venous: The inferior vena cava is normal in size with greater than 50% respiratory variability, suggesting right atrial pressure of 3 mmHg. IAS/Shunts: No atrial level shunt detected by color flow Doppler.  LEFT VENTRICLE PLAX 2D LVIDd:         3.20 cm     Diastology LVIDs:         2.10 cm     LV e' medial:    9.79 cm/s LV PW:         1.60 cm     LV E/e' medial:  7.4 LV IVS:        1.30 cm     LV e' lateral:   10.60 cm/s LVOT diam:     2.20 cm     LV E/e' lateral: 6.9 LV SV:         81 LV SV Index:   45 LVOT Area:     3.80 cm  LV Volumes (MOD) LV vol d, MOD A2C: 45.7 ml LV vol d, MOD A4C: 37.4 ml LV vol s, MOD A2C: 13.9 ml LV vol s, MOD A4C: 15.0 ml LV SV MOD A2C:     31.8 ml LV SV MOD A4C:     37.4 ml LV SV MOD BP:      27.1 ml RIGHT VENTRICLE             IVC RV S prime:     23.20 cm/s  IVC diam: 1.70 cm TAPSE (M-mode): 2.5 cm LEFT ATRIUM             Index      RIGHT ATRIUM           Index LA diam:        3.10 cm 1.72 cm/m RA Area:     15.80 cm LA Vol (A2C):   12.9 ml 7.16 ml/m RA Volume:  42.30 ml  23.47 ml/m LA Vol (A4C):   7.6 ml  4.23 ml/m LA Biplane Vol: 10.4 ml 5.77 ml/m  AORTIC VALVE LVOT Vmax:   135.00 cm/s LVOT Vmean:  88.900 cm/s LVOT VTI:    0.214 m  AORTA Ao Root diam: 3.50 cm Ao Asc diam:  3.20 cm MITRAL VALVE                TRICUSPID VALVE MV Area (PHT): 3.27 cm     TR Peak grad:   18.1 mmHg MV Decel Time: 232 msec     TR Vmax:        213.00 cm/s MV E velocity: 72.70 cm/s MV A velocity: 104.00 cm/s  SHUNTS MV E/A ratio:  0.70         Systemic VTI:  0.21 m                             Systemic Diam: 2.20 cm Skeet Latch MD Electronically signed by Skeet Latch MD Signature Date/Time: 08/11/2020/11:49:12 AM    Final      Assessment and Plan:   Chest pain, elevated troponin -Range is 130-169, very mild crescendo decrescendo pattern - Other than baby aspirin 81 mg, he was not  given any medications for the pain. - His EF is normal on echo with no wall motion abnormalities. -His respiratory illness is severe enough that he is not a candidate for any ischemic evaluation at this time - Continue to monitor her symptoms - Continue aspirin and statin, no BB 2nd lung dz -Discuss with MD if we should add Imdur  2.  COPD exacerbation - He has been placed on nebulizers and steroids per IM - However, his lung disease is severe enough that he cannot eat without becoming significantly more dyspneic than his baseline which is pretty bad - CCM has been asked to see him. - He is a full code   Risk Assessment/Risk Scores:     HEAR Score (for undifferentiated chest pain):  HEAR Score: 4  New York Heart Association (NYHA) Functional Class NYHA Class IV        For questions or updates, please contact [facility] Please consult [email or web address] for contact info under    Signed, Rosaria Ferries, PA-C  08/11/2020 3:04 PM

## 2020-08-11 NOTE — Progress Notes (Signed)
PROGRESS NOTE    Andrew Hodges  KWI:[id number] DOB: [birth year] DOA: 08/16/2020 PCP: Pcp, No   Chief Complaint  Patient presents with   Chest Pain   Brief Narrative: 75 year old male who is in very poor health with history of colon cancer-status post subtotal colectomy 09/2016, lung cancer status post stereotactic radiation therapy, history of DVT on Eliquis, HTN, HLD, severe COPD on 4 L nasal cannula oxygen, sleep apnea, A. Fib, CAD on ASA/statin beta-blocker, CKD stage IIIa Baseline creatinine 1.5, recent admission for respiratory failure/COPD exacerbation sent from [facility] due to worsening shortness of breath, chest pain, positive troponin cough. In the ED, chest x-ray showed right lung base atelectasis versus infiltrate, 14 mm nodular density, mild leukocytosis lactic acidosis.  Troponin 134, 169 156 130 venous gas 7.37/69.8/45.  Patient was acutely short of breath hypoxic and was needing 5 to 8 L nasal cannula and subsequently BiPAP. Pulmonary and cardiology was consulted  Subjective: Seen and examined this morning.  Off BiPAP since 5:55 AM this morning on 4 L nasal cannula appears short of breath but not in acute distress able to speak some and easily gets short of breath even with eating. Currently denies any chest pain.  Assessment & Plan:  Acute on chronic hypoxic respiratory failure HCAP Acute on chronic endstage COPD exacerbation: Decompensation of his respiratory status due to pneumonia/COPD exacerbation with chronic CHF. Off BiPAP currently and doing well on 4 L nasal cannula.  Continue on current management with DuoNeb, Pulmicort neb, aggressive pulmonary toileting/incentive spirometry flutter valve, ceftriaxone/azithromycin, IV steroid 40 mg q12hr and lasix iv. COVID-19 respiratory viral panel is negative.  Leukocytosis downtrending respiratory status improving.Follow-up blood culture.  Appreciate pulmonary input. Recent Labs  Lab 08/09/20 1900 08/14/2020 1623 08/05/2020 1636  07/29/2020 1850 08/11/20 0407  WBC 15.3*  14.8* 13.6*  --   --  10.5  LATICACIDVEN  --   --  2.1* 1.3  --   PROCALCITON  --   --   --  0.14 0.16   Chronic CHF: BNP on admission normal 64.  Echo pending to evaluate EF.  Continue Lasix as he seems to be responding well. Cardiology consulted.Net IO Since Admission: -650 mL [08/11/20 1124]  Filed Weights   07/25/2020 2051  Weight: 65.8 kg   Intake/Output Summary (Last 24 hours) at 08/11/2020 1124 Last data filed at 08/11/2020 0806 Gross per 24 hour  Intake --  Output 650 ml  Net -650 ml    Elevated troponin without much delta patient had some chest pain.  Suspect in setting of acute hypoxic respiratory failure cardio has been consulted. CAD on statin aspirin  Hyperkalemia patient was on potassium supplementation and ARB on CKD.  Continue Lokelma continue Lasix.  Monitor Recent Labs  Lab 08/11/2020 1507 07/20/2020 1632 07/28/2020 2024 08/11/20 0407  K 5.7* 5.4* 5.6* 5.3*    CKD stage IIIa: Baseline creatinine around 1.6-1.7 2021 monitor closely while on diuretics. Recent Labs  Lab 07/20/2020 1507 08/08/2020 2024 08/11/20 0407  BUN 73* 70* 68*  CREATININE 1.72* 1.73* 1.73*   Essential hypertension: Stable on Cardizem, holding ARB due to hyperkalemia  PAF continue Cardizem currently on heparin can transition to DOAC once stable  HLD continue statin  OSA on CPAP: Poor compliance with CPAP start CPAP with oxygen bleed at bedtime per pulmonary at 18 cmH2O if unable to tolerate switch to auto set 8-20.  Pulmonary nodule History of lung cancer Former smoker 45-pack-year Per pulmonary will need CT chest in follow-up  Goals of care: Does not appear to be bright in this gentleman with complex medical comorbidities.  discussed about CODE STATUS encouraged  him to discuss with his power of attorney/friend. Will benefit with palliative care consult as OP.  Diet Order             Diet Heart Room service appropriate? Yes; Fluid consistency:  Thin  Diet effective now                  Patient's Body mass index is 21.41 kg/m.  DVT prophylaxis: heparin Code Status:   Code Status: Full Code  Family Communication: plan of care discussed with patient at bedside. Status is: Inpatient Remains inpatient appropriate because:IV treatments appropriate due to intensity of illness or inability to take PO and Inpatient level of care appropriate due to severity of illness Dispo: The patient is from:  [facility]              Anticipated d/c is to:  tbd              Patient currently is not medically stable to d/c.   Difficult to place patient No  Unresulted Labs (From admission, onward)     Start     Ordered   2020-08-30 0500  Heparin level (unfractionated)  Daily,   R      07/25/2020 2247   Aug 30, 2020 0500  APTT  Daily,   R      07/26/2020 2247   2020-08-30 0500  Magnesium  Once,   R        08/11/20 1012   08/11/20 0959  Magnesium  Once,   STAT        08/11/20 1012   08/11/20 0700  APTT  Once,   R        08/11/20 0700   08/11/20 0500  Procalcitonin  Daily,   R      07/28/2020 1940   08/11/20 0106  Blood gas, arterial  ONCE - STAT,   R       Question:  Room air or oxygen  Answer:  Oxygen   08/11/20 0105   08/02/2020 2040  Legionella Pneumophila Serogp 1 Ur Ag  (COPD / Pneumonia / Cellulitis / Lower Extremity Wound)  Once,   STAT        07/17/2020 2039   07/22/2020 1942  Expectorated Sputum Assessment w Gram Stain, Rflx to Resp Cult  Once,   R       Question Answer Comment  Patient immune status Immunocompromised   Release to patient Immediate      08/02/2020 1942   07/27/2020 1403  CBC with Differential  ONCE - STAT,   STAT        08/14/2020 1403           Medications reviewed:  Scheduled Meds:  aspirin EC  81 mg Oral Daily   atorvastatin  20 mg Oral Daily   budesonide (PULMICORT) nebulizer solution  0.25 mg Nebulization BID   diltiazem  180 mg Oral Daily   furosemide  40 mg Intravenous BID   guaiFENesin  10 mL Oral Q12H    ipratropium-albuterol  3 mL Nebulization Q6H   methylPREDNISolone (SOLU-MEDROL) injection  40 mg Intravenous Q12H   sodium chloride flush  3 mL Intravenous Q12H   sodium zirconium cyclosilicate  10 g Oral Daily   sodium zirconium cyclosilicate  10 g Oral Once   valACYclovir  500 mg Oral BID   Continuous Infusions:  sodium chloride     azithromycin     cefTRIAXone (ROCEPHIN)  IV     heparin 800 Units/hr (07/29/2020 2302)   Consultants:see note  Procedures:see note Antimicrobials: Anti-infectives (From admission, onward)    Start     Dose/Rate Route Frequency Ordered Stop   08/11/20 2000  cefTRIAXone (ROCEPHIN) 1 g in sodium chloride 0.9 % 100 mL IVPB        1 g 200 mL/hr over 30 Minutes Intravenous Every 24 hours 08/04/2020 2039     08/11/20 2000  azithromycin (ZITHROMAX) 500 mg in sodium chloride 0.9 % 250 mL IVPB        500 mg 250 mL/hr over 60 Minutes Intravenous Every 24 hours 07/19/2020 2039 08/16/20 1959   08/15/2020 2200  valACYclovir (VALTREX) tablet 500 mg        500 mg Oral 2 times daily 08/05/2020 2039     08/04/2020 1830  cefTRIAXone (ROCEPHIN) 1 g in sodium chloride 0.9 % 100 mL IVPB        1 g 200 mL/hr over 30 Minutes Intravenous  Once 07/19/2020 1818 07/26/2020 2038   08/01/2020 1830  azithromycin (ZITHROMAX) 500 mg in sodium chloride 0.9 % 250 mL IVPB        500 mg 250 mL/hr over 60 Minutes Intravenous  Once 08/14/2020 1818 08/04/2020 2038      Culture/Microbiology    Component Value Date/Time   SDES BLOOD LEFT ANTECUBITAL 07/31/2020 1623   SDES BLOOD LEFT FOREARM 08/11/2020 1623   SPECREQUEST  07/28/2020 1623    BOTTLES DRAWN AEROBIC AND ANAEROBIC Blood Culture results may not be optimal due to an inadequate volume of blood received in culture bottles   SPECREQUEST  07/21/2020 1623    BOTTLES DRAWN AEROBIC AND ANAEROBIC Blood Culture results may not be optimal due to an inadequate volume of blood received in culture bottles   CULT  08/04/2020 1623    NO GROWTH < 24  HOURS Performed at [facility], [place] [address] [place], [place] [address]    CULT  08/03/2020 1623    NO GROWTH < 24 HOURS Performed at [facility], [place] [address], [place], [place] [address]    REPTSTATUS PENDING 07/26/2020 1623   REPTSTATUS PENDING 07/18/2020 1623    Other culture-see note  Objective: Vitals: Today's Vitals   08/11/20 0815 08/11/20 0900 08/11/20 0915 08/11/20 0945  BP: (!) 152/84 (!) 155/84  (!) 155/80  Pulse: 90 85 83 88  Resp: (!) 25 (!) 26 (!) 29 (!) 29  Temp:      TempSrc:      SpO2: 96% 96% 96% 98%  Weight:      Height:      PainSc:        Intake/Output Summary (Last 24 hours) at 08/11/2020 1112 Last data filed at 08/11/2020 0806 Gross per 24 hour  Intake --  Output 650 ml  Net -650 ml   Filed Weights   07/29/2020 2051  Weight: 65.8 kg   Weight change:   Intake/Output from previous day: No intake/output data recorded. Intake/Output this shift: Total I/O In: -  Out: 650 [Urine:650] Filed Weights   08/03/2020 2051  Weight: 65.8 kg   Examination: General exam: AAO x , mildly short of breath even during interaction, on nasal cannula oxygen older than stated age, weak appearing. HEENT:Oral mucosa moist, Ear/Nose WNL grossly,dentition normal. Respiratory system: bilaterally diminished, mild wheezing, some use of accessory muscle during talk/swallowing Cardiovascular system: S1 &  S2 +,No JVD. Gastrointestinal system: Abdomen soft, NT,ND, BS+. Nervous System:Alert, awake, moving extremities Extremities: + edema, distal peripheral pulses palpable.  Skin: No rashes,no icterus. MSK: Normal muscle bulk,tone, power Data Reviewed: I have personally reviewed following labs and imaging studies CBC: Recent Labs  Lab 08/09/20 1900 07/26/2020 1623 08/11/2020 1632 08/11/20 0407  WBC 15.3*  14.8* 13.6*  --  10.5  NEUTROABS 14.2* 12.6*  --  10.1*  HGB 14.1  13.9 14.3 15.3 13.5  HCT 44.8  44.3 47.1 45.0 45.3  MCV 93.3  93.9 94.8  --   94.8  PLT 97*  98* 89*  --  76*   Basic Metabolic Panel: Recent Labs  Lab 07/26/2020 1507 08/14/2020 1632 08/16/2020 2024 08/11/20 0407  NA 148* 146* 150* 148*  K 5.7* 5.4* 5.6* 5.3*  CL 106  --  104 102  CO2 33*  --  37* 39*  GLUCOSE 124*  --  97 101*  BUN 73*  --  70* 68*  CREATININE 1.72*  --  1.73* 1.73*  CALCIUM 9.2  --  8.9 9.0  MG  --   --   --  2.2  PHOS  --   --   --  5.2*   GFR: Estimated Creatinine Clearance: 35.9 mL/min (A) (by C-G formula based on SCr of 1.73 mg/dL (H)). Liver Function Tests: Recent Labs  Lab 07/23/2020 1507 08/11/20 0407  AST 29 28  ALT 33 30  ALKPHOS 45 42  BILITOT 0.6 0.6  PROT 5.7* 5.4*  ALBUMIN 3.1* 2.8*   No results for input(s): LIPASE, AMYLASE in the last 168 hours. No results for input(s): AMMONIA in the last 168 hours. Coagulation Profile: Recent Labs  Lab 08/05/2020 2308  INR 1.2   Cardiac Enzymes: Recent Labs  Lab 08/11/20 0053  CKTOTAL 79   BNP (last 3 results) No results for input(s): PROBNP in the last 8760 hours. HbA1C: No results for input(s): HGBA1C in the last 72 hours. CBG: Recent Labs  Lab 07/29/2020 2247  GLUCAP 94   Lipid Profile: No results for input(s): CHOL, HDL, LDLCALC, TRIG, CHOLHDL, LDLDIRECT in the last 72 hours. Thyroid Function Tests: Recent Labs    08/11/20 0407  TSH 1.330   Anemia Panel: No results for input(s): VITAMINB12, FOLATE, FERRITIN, TIBC, IRON, RETICCTPCT in the last 72 hours. Sepsis Labs: Recent Labs  Lab 08/15/2020 1636 08/08/2020 1850 08/11/20 0407  PROCALCITON  --  0.14 0.16  LATICACIDVEN 2.1* 1.3  --     Recent Results (from the past 240 hour(s))  Blood culture (routine x 2)     Status: None (Preliminary result)   Collection Time: 08/08/2020  4:23 PM   Specimen: BLOOD  Result Value Ref Range Status   Specimen Description BLOOD LEFT ANTECUBITAL  Final   Special Requests   Final    BOTTLES DRAWN AEROBIC AND ANAEROBIC Blood Culture results may not be optimal due to an  inadequate volume of blood received in culture bottles   Culture   Final    NO GROWTH < 24 HOURS Performed at [facility], [place] [address] [place], [place] [address]    Report Status PENDING  Incomplete  Blood culture (routine x 2)     Status: None (Preliminary result)   Collection Time: 08/03/2020  4:23 PM   Specimen: BLOOD LEFT FOREARM  Result Value Ref Range Status   Specimen Description BLOOD LEFT FOREARM  Final   Special Requests   Final    BOTTLES  DRAWN AEROBIC AND ANAEROBIC Blood Culture results may not be optimal due to an inadequate volume of blood received in culture bottles   Culture   Final    NO GROWTH < 24 HOURS Performed at [facility] [address], [place], [place] [address]    Report Status PENDING  Incomplete  Resp Panel by RT-PCR (Flu A&B, Covid) Nasopharyngeal Swab     Status: None   Collection Time: 08/05/2020  4:25 PM   Specimen: Nasopharyngeal Swab; Nasopharyngeal(NP) swabs in vial transport medium  Result Value Ref Range Status   SARS Coronavirus 2 by RT PCR NEGATIVE NEGATIVE Final    Comment: (NOTE) SARS-CoV-2 target nucleic acids are NOT DETECTED.  The SARS-CoV-2 RNA is generally detectable in upper respiratory specimens during the acute phase of infection. The lowest concentration of SARS-CoV-2 viral copies this assay can detect is 138 copies/mL. A negative result does not preclude SARS-Cov-2 infection and should not be used as the sole basis for treatment or other patient management decisions. A negative result may occur with  improper specimen collection/handling, submission of specimen other than nasopharyngeal swab, presence of viral mutation(s) within the areas targeted by this assay, and inadequate number of viral copies(<138 copies/mL). A negative result must be combined with clinical observations, patient history, and epidemiological information. The expected result is Negative.  Fact Sheet for Patients:   EntrepreneurPulse.com.au  Fact Sheet for Healthcare Providers:  IncredibleEmployment.be  This test is no t yet approved or cleared by the [place] FDA and  has been authorized for detection and/or diagnosis of SARS-CoV-2 by FDA under an Emergency Use Authorization (EUA). This EUA will remain  in effect (meaning this test can be used) for the duration of the COVID-19 declaration under Section 564(b)(1) of the Act, 21 U.S.C.section 360bbb-3(b)(1), unless the authorization is terminated  or revoked sooner.       Influenza A by PCR NEGATIVE NEGATIVE Final   Influenza B by PCR NEGATIVE NEGATIVE Final    Comment: (NOTE) The Xpert Xpress SARS-CoV-2/FLU/RSV plus assay is intended as an aid in the diagnosis of influenza from Nasopharyngeal swab specimens and should not be used as a sole basis for treatment. Nasal washings and aspirates are unacceptable for Xpert Xpress SARS-CoV-2/FLU/RSV testing.  Fact Sheet for Patients: EntrepreneurPulse.com.au  Fact Sheet for Healthcare Providers: IncredibleEmployment.be  This test is not yet approved or cleared by the [place] FDA and has been authorized for detection and/or diagnosis of SARS-CoV-2 by FDA under an Emergency Use Authorization (EUA). This EUA will remain in effect (meaning this test can be used) for the duration of the COVID-19 declaration under Section 564(b)(1) of the Act, 21 U.S.C. section 360bbb-3(b)(1), unless the authorization is terminated or revoked.  Performed at [facility], [place] [address], [place], [place] [address]      Radiology Studies: DG Chest 2 View  Result Date: 08/09/2020 CLINICAL DATA:  75 year old male with chest pain and shortness of breath. Cough. EXAM: CHEST - 2 VIEW COMPARISON:  None FINDINGS: Patchy area airspace density at the right lung base may represent atelectasis or infiltrate. Clinical correlation and  follow-up to resolution recommended. Trace right pleural effusion may be present. A 14 mm nodular density in the right suprahilar region may represent an area of scarring or confluence of vasculature. Direct comparison with prior images, if available, recommended. If no prior images are available attention on close follow-up imaging or further evaluation with chest CT is recommended. The left lung is clear.  No pneumothorax. Top-normal cardiac size. Atherosclerotic calcification of the aorta. No acute osseous pathology. IMPRESSION: 1. Right lung base atelectasis or infiltrate. Clinical correlation and follow-up to resolution recommended. 2. A 14 mm nodular density in the right suprahilar region. Direct comparison with prior or follow-up imaging studies or further evaluation with chest CT on a nonemergent/outpatient basis recommended. Electronically Signed   By: Anner Crete M.D.   On: 07/25/2020 15:20   DG CHEST PORT 1 VIEW  Result Date: 08/11/2020 CLINICAL DATA:  Abnormal troponin levels. EXAM: PORTABLE CHEST 1 VIEW COMPARISON:  August 10, 2020 FINDINGS: Mild, stable atelectasis and/or infiltrate is seen within the right lung base. A stable 14 mm nodular opacity is seen within the suprahilar region on the right. Ill-defined surgical sutures are seen overlying the right upper lobe. There is no evidence of a pleural effusion or pneumothorax. A prominent curvilinear skin fold is suspected along the upper right lung. The heart size and mediastinal contours are within normal limits. There is mild calcification of the aortic arch. Degenerative changes seen throughout the thoracic spine. IMPRESSION: 1. Mild, stable right basilar atelectasis and/or infiltrate. 2. 14 mm right suprahilar nodular opacity. Further evaluation with nonemergent chest CT is recommended to exclude the presence of an underlying neoplastic process. 3. Prominent skin fold overlying the upper right lung. Repeat chest plain film with improved  patient positioning is recommended to further exclude the presence of an underlying pneumothorax. Electronically Signed   By: Virgina Norfolk M.D.   On: 08/11/2020 03:15     LOS: 1 day   Antonieta Pert, MD [facility]  08/11/2020, 11:12 AM

## 2020-08-11 NOTE — ED Notes (Signed)
PT now satting 88% on 10LPM [place], w/ increased work of breathing, MD Doutova paged and made aware

## 2020-08-11 NOTE — Evaluation (Signed)
Clinical/Bedside Swallow Evaluation Patient Details  Name: Andrew Hodges MRN: [id number] Date of Birth: Feb 10, 1945  Today's Date: 08/11/2020 Time: SLP Start Time (ACUTE ONLY): 0930 SLP Stop Time (ACUTE ONLY): 0949 SLP Time Calculation (min) (ACUTE ONLY): 19 min  Past Medical History:  Past Medical History:  Diagnosis Date   Atherosclerosis    coronary artery   CHF (congestive heart failure) (HCC)    history of   Depression    Emphysema lung ([place])    Hyperlipidemia    Hypertension    Lung cancer ([facility]) 04/01/13   RYL adenocarcinoma   Osteoarthritis    Pneumothorax 03/2013   history. following lung biopsy   Radiation 4/21,4/23,4/27,4/29,5/1   Right upper lobe of lung   Past Surgical History:  Past Surgical History:  Procedure Laterality Date   COLON SURGERY     EXCISIONAL HEMORRHOIDECTOMY     LUMBAR LAMINECTOMY     many years ago   LUNG BIOPSY  04/01/2013   RUL- adenocarcinoma   HPI:  75 y.o. male with medical history significant of [place] ca, Lung CA   DVT (on Eliquis), HTN,HLD,severe COPD on home O2, sleep apnea, atrial fibrillation on apixaban   Admitted for elevated troponin and acute on chronic respiratory failure. HCAP vs COPD exacerbation.   Assessment / Plan / Recommendation Clinical Impression  Pt demosntrates signs of mild dysphagia, seen in the ED. Pt has visible chest retractions with breathing, work of breath increased while eating and drinking. Pt has intermittent single cough after 10% of sips or bites. Suspect a respiratory based dysphagia. Would like to observe pt again after some medical management of breathing has occurred to determine possible chronicity of problem and need for instrumental testing. Will f/u tomorrow and plan for MBS if pt still demonstrates signs of impairment. SLP Visit Diagnosis: Dysphagia, oropharyngeal phase (R13.12)    Aspiration Risk  Mild aspiration risk    Diet Recommendation Regular;Thin liquid   Liquid Administration via:  Cup;Straw Medication Administration: Whole meds with liquid Supervision: Patient able to self feed Compensations: Slow rate;Small sips/bites Postural Changes: Seated upright at 90 degrees    Other  Recommendations Oral Care Recommendations: Oral care BID   Follow up Recommendations 24 hour supervision/assistance      Frequency and Duration min 2x/week  1 week       Prognosis Prognosis for Safe Diet Advancement: Good      Swallow Study   General HPI: 75 y.o. male with medical history significant of [place] ca, Lung CA   DVT (on Eliquis), HTN,HLD,severe COPD on home O2, sleep apnea, atrial fibrillation on apixaban   Admitted for elevated troponin and acute on chronic respiratory failure. HCAP vs COPD exacerbation. Type of Study: Bedside Swallow Evaluation Previous Swallow Assessment: none Diet Prior to this Study: Regular;Thin liquids Temperature Spikes Noted: No Respiratory Status: Nasal cannula History of Recent Intubation: No Behavior/Cognition: Alert;Cooperative;Pleasant mood Oral Cavity Assessment: Within Functional Limits Oral Care Completed by SLP: No Oral Cavity - Dentition: Adequate natural dentition Vision: Functional for self-feeding Self-Feeding Abilities: Able to feed self Patient Positioning: Upright in bed Baseline Vocal Quality: Normal Volitional Cough: Strong;Congested Volitional Swallow: Able to elicit    Oral/Motor/Sensory Function Overall Oral Motor/Sensory Function: Within functional limits   Ice Chips     Thin Liquid Thin Liquid: Impaired Presentation: Straw;Self Fed Pharyngeal  Phase Impairments: Cough - Delayed;Cough - Immediate    Nectar Thick Nectar Thick Liquid: Not tested   Honey Thick Honey Thick Liquid: Not tested  Puree Puree: Within functional limits   Solid     Solid: Impaired Presentation: Self Fed Pharyngeal Phase Impairments: Cough - Immediate;Cough - Delayed      Tiffiney Sparrow, Katherene Ponto 08/11/2020,11:24 AM

## 2020-08-12 ENCOUNTER — Inpatient Hospital Stay (HOSPITAL_COMMUNITY): Payer: Medicare HMO

## 2020-08-12 ENCOUNTER — Other Ambulatory Visit: Payer: Self-pay

## 2020-08-12 DIAGNOSIS — I469 Cardiac arrest, cause unspecified: Secondary | ICD-10-CM

## 2020-08-12 DIAGNOSIS — N179 Acute kidney failure, unspecified: Secondary | ICD-10-CM

## 2020-08-12 DIAGNOSIS — I480 Paroxysmal atrial fibrillation: Secondary | ICD-10-CM

## 2020-08-12 DIAGNOSIS — I2583 Coronary atherosclerosis due to lipid rich plaque: Secondary | ICD-10-CM

## 2020-08-12 DIAGNOSIS — R778 Other specified abnormalities of plasma proteins: Secondary | ICD-10-CM | POA: Diagnosis not present

## 2020-08-12 DIAGNOSIS — J9621 Acute and chronic respiratory failure with hypoxia: Secondary | ICD-10-CM | POA: Diagnosis not present

## 2020-08-12 DIAGNOSIS — I10 Essential (primary) hypertension: Secondary | ICD-10-CM | POA: Diagnosis not present

## 2020-08-12 DIAGNOSIS — J9602 Acute respiratory failure with hypercapnia: Secondary | ICD-10-CM

## 2020-08-12 DIAGNOSIS — J9601 Acute respiratory failure with hypoxia: Secondary | ICD-10-CM

## 2020-08-12 DIAGNOSIS — N183 Chronic kidney disease, stage 3 unspecified: Secondary | ICD-10-CM

## 2020-08-12 DIAGNOSIS — E7800 Pure hypercholesterolemia, unspecified: Secondary | ICD-10-CM | POA: Diagnosis not present

## 2020-08-12 LAB — LEGIONELLA PNEUMOPHILA SEROGP 1 UR AG: L. pneumophila Serogp 1 Ur Ag: NEGATIVE

## 2020-08-12 LAB — MAGNESIUM: Magnesium: 2.2 mg/dL (ref 1.7–2.4)

## 2020-08-12 LAB — BASIC METABOLIC PANEL
Anion gap: 8 (ref 5–15)
BUN: 84 mg/dL — ABNORMAL HIGH (ref 8–23)
CO2: 36 mmol/L — ABNORMAL HIGH (ref 22–32)
Calcium: 8.5 mg/dL — ABNORMAL LOW (ref 8.9–10.3)
Chloride: 102 mmol/L (ref 98–111)
Creatinine, Ser: 1.96 mg/dL — ABNORMAL HIGH (ref 0.61–1.24)
GFR, Estimated: 36 mL/min — ABNORMAL LOW (ref >60)
Glucose, Bld: 104 mg/dL — ABNORMAL HIGH (ref 70–99)
Potassium: 5.7 mmol/L — ABNORMAL HIGH (ref 3.5–5.1)
Sodium: 146 mmol/L — ABNORMAL HIGH (ref 135–145)

## 2020-08-12 LAB — CBC
HCT: 37.4 % — ABNORMAL LOW (ref 39.0–52.0)
Hemoglobin: 11.5 g/dL — ABNORMAL LOW (ref 13.0–17.0)
MCH: 28 pg (ref 26.0–34.0)
MCHC: 30.7 g/dL (ref 30.0–36.0)
MCV: 91.2 fL (ref 80.0–100.0)
Platelets: 68 K/uL — ABNORMAL LOW (ref 150–400)
RBC: 4.1 MIL/uL — ABNORMAL LOW (ref 4.22–5.81)
RDW: 15.4 % (ref 11.5–15.5)
WBC: 15.8 K/uL — ABNORMAL HIGH (ref 4.0–10.5)
nRBC: 0 % (ref 0.0–0.2)

## 2020-08-12 LAB — APTT: aPTT: 83 seconds — ABNORMAL HIGH (ref 24–36)

## 2020-08-12 LAB — PROCALCITONIN: Procalcitonin: 0.19 ng/mL

## 2020-08-12 LAB — HEPARIN LEVEL (UNFRACTIONATED): Heparin Unfractionated: >1.1 [IU]/mL — ABNORMAL HIGH (ref 0.30–0.70)

## 2020-08-12 MED ORDER — DEXMEDETOMIDINE HCL IN NACL 400 MCG/100ML IV SOLN: 0.0000 ug/kg/h | INTRAVENOUS | Status: DC

## 2020-08-12 MED ORDER — NOREPINEPHRINE 4 MG/250ML-% IV SOLN: 0.0000 ug/min | INTRAVENOUS | Status: DC

## 2020-08-12 MED ORDER — CALCIUM CHLORIDE 10 % IV SOLN
INTRAVENOUS | Status: AC
  Filled 2020-08-12: qty 20

## 2020-08-12 MED ORDER — NOREPINEPHRINE 4 MG/250ML-% IV SOLN
INTRAVENOUS | Status: AC
  Filled 2020-08-12: qty 250

## 2020-08-12 MED ORDER — EPINEPHRINE 1 MG/10ML IJ SOSY
PREFILLED_SYRINGE | INTRAMUSCULAR | Status: AC
  Filled 2020-08-12: qty 40

## 2020-08-12 MED ORDER — PIPERACILLIN-TAZOBACTAM 3.375 G IVPB
3.3750 g | Freq: Three times a day (TID) | INTRAVENOUS | Status: DC
  Filled 2020-08-12 (×2): qty 50

## 2020-08-12 MED ORDER — ETOMIDATE 2 MG/ML IV SOLN
INTRAVENOUS | Status: AC
  Filled 2020-08-12: qty 20

## 2020-08-12 MED ORDER — FENTANYL BOLUS VIA INFUSION
25.0000 ug | INTRAVENOUS | Status: DC | PRN
  Filled 2020-08-12: qty 100

## 2020-08-12 MED ORDER — SODIUM CHLORIDE 0.9 % IV BOLUS
500.0000 mL | Freq: Once | INTRAVENOUS | Status: AC
  Administered 2020-08-12: 500 mL via INTRAVENOUS

## 2020-08-12 MED ORDER — DOCUSATE SODIUM 50 MG/5ML PO LIQD: 100.0000 mg | Freq: Two times a day (BID) | ORAL | Status: DC

## 2020-08-12 MED ORDER — SODIUM BICARBONATE 8.4 % IV SOLN
INTRAVENOUS | Status: AC
  Filled 2020-08-12: qty 150

## 2020-08-12 MED ORDER — FENTANYL CITRATE (PF) 100 MCG/2ML IJ SOLN
INTRAMUSCULAR | Status: AC
  Filled 2020-08-12: qty 2

## 2020-08-12 MED ORDER — FENTANYL CITRATE (PF) 100 MCG/2ML IJ SOLN: 25.0000 ug | Freq: Once | INTRAMUSCULAR | Status: DC

## 2020-08-12 MED ORDER — SODIUM BICARBONATE 8.4 % IV SOLN
INTRAVENOUS | Status: AC
  Filled 2020-08-12: qty 50

## 2020-08-12 MED ORDER — POLYVINYL ALCOHOL 1.4 % OP SOLN
1.0000 [drp] | Freq: Two times a day (BID) | OPHTHALMIC | Status: DC | PRN
  Administered 2020-08-12: 1 [drp] via OPHTHALMIC
  Filled 2020-08-12: qty 15

## 2020-08-12 MED ORDER — FENTANYL 2500MCG IN NS 250ML (10MCG/ML) PREMIX INFUSION: 25.0000 ug/h | INTRAVENOUS | Status: DC

## 2020-08-12 MED ORDER — HEPARIN (PORCINE) 25000 UT/250ML-% IV SOLN: 600.0000 [IU]/h | INTRAVENOUS | Status: DC

## 2020-08-12 MED ORDER — CHLORHEXIDINE GLUCONATE CLOTH 2 % EX PADS: 6.0000 | MEDICATED_PAD | Freq: Every day | CUTANEOUS | Status: DC

## 2020-08-12 MED ORDER — ROCURONIUM BROMIDE 10 MG/ML (PF) SYRINGE
PREFILLED_SYRINGE | INTRAVENOUS | Status: AC
  Filled 2020-08-12: qty 10

## 2020-08-12 MED ORDER — SODIUM BICARBONATE 8.4 % IV SOLN
INTRAVENOUS | Status: AC
  Filled 2020-08-12: qty 100

## 2020-08-12 MED ORDER — POLYETHYLENE GLYCOL 3350 17 G PO PACK: 17.0000 g | PACK | Freq: Every day | ORAL | Status: DC

## 2020-08-12 MED ORDER — POTASSIUM CHLORIDE CRYS ER 20 MEQ PO TBCR: 20.0000 meq | EXTENDED_RELEASE_TABLET | Freq: Once | ORAL | Status: DC

## 2020-08-12 MED ORDER — MIDAZOLAM HCL 2 MG/2ML IJ SOLN
INTRAMUSCULAR | Status: AC
  Filled 2020-08-12: qty 2

## 2020-08-12 MED ORDER — APIXABAN 5 MG PO TABS: 5.0000 mg | ORAL_TABLET | Freq: Two times a day (BID) | ORAL | Status: DC

## 2020-08-15 LAB — CULTURE, BLOOD (ROUTINE X 2)
Culture: NO GROWTH
Culture: NO GROWTH

## 2020-08-17 NOTE — Progress Notes (Signed)
  Speech Language Pathology Treatment: Dysphagia  Patient Details Name: Andrew Hodges MRN: [id number] DOB: 01-07-1946 Today's Date: 08-19-20 Time: 1443-1540 SLP Time Calculation (min) (ACUTE ONLY): 10 min  Assessment / Plan / Recommendation Clinical Impression  Pts respiratory status is even worse than yesterday. RR into 40s at times. Pt attempted to sip water with gasping. Pt should not be on an oral diet at this time. Will make NPO except for sips of water, Med crushed in puree. Will f/u, will likely need MBS when medically stable.   HPI HPI: 75 y.o. male with medical history significant of [place] ca, Lung CA   DVT (on Eliquis), HTN,HLD,severe COPD on home O2, sleep apnea, atrial fibrillation on apixaban   Admitted for elevated troponin and acute on chronic respiratory failure. HCAP vs COPD exacerbation.      SLP Plan  Continue with current plan of care;MBS       Recommendations  Diet recommendations: NPO Liquids provided via: Straw;Cup                Oral Care Recommendations: Oral care BID Follow up Recommendations: 24 hour supervision/assistance Plan: Continue with current plan of care;MBS       GO               Andrew Baltimore, MA [place] Pager [phone] Office [phone]  Lynann Beaver 08/19/20, 10:35 AM

## 2020-08-17 NOTE — Progress Notes (Signed)
NAME:  Andrew Hodges, MRN:  [id number], DOB:  11-25-1945, LOS: 2 ADMISSION DATE:  07/30/2020, CONSULTATION DATE:  08/11/2020 REFERRING MD: Roel Cluck , CHIEF COMPLAINT:   COPD exacerbation/ OSA  History of Present Illness:  75 y.o. Presented to the ED 7/25 with shortness of breath and intermittent chest pain x  1 week. ( Pain scale 8-10) increasing oxygen requirement from baseline 5 L to 8 L and increased work of breathing. Pt had a recent admission at OSH for COPD exacerbation, hypercarbia. He was treated with BiPAP , antibiotics, steroids , BD nebs and was transitioned to [facility]. He was transitioned to [facility] 08/05/2020 where he has been, until yesterday (7/25) when he was transferred to [facility] for chest pain and elevated troponins. EKG was negative for STEMI  Pt. Was admitted by [facility] , PCCM have been consulted to assist with care for acute on chronic respiratory failure 2/2 COPD exacerbation / HCAP/and hypercarbia.   Pertinent  Medical History  CHF COPD Prior smoker Lung cancer  Depression  HTN  Significant Hospital Events:  7/25 admission to [facility]  7/20 Admission to [facility] from OSH  Anti-infective agents Azithromycin 7/25>> Rocephin 7/25>> Valtrex 7/25>>  Cultures 07/30/2020 Blood  07/24/2020 Sars Covid >> Negative  Interim History / Subjective:  Seen sitting up in bed in acute respiratory distress with reported dyspnea    Objective   Blood pressure 132/82, pulse 86, temperature 97.6 F (36.4 C), temperature source Axillary, resp. rate (!) 23, height 5\' 9"  (1.753 m), weight 65.8 kg, SpO2 98 %.    FiO2 (%):  [40 %] 40 %   Intake/Output Summary (Last 24 hours) at 09/10/20 0940 Last data filed at 09-10-20 0500 Gross per 24 hour  Intake 865.69 ml  Output 500 ml  Net 365.69 ml    Filed Weights   08/02/2020 2051 08/11/20 1832  Weight: 65.8 kg 65.8 kg    Examination: General: Acute on chronically ill appearing elderly  male lying in bed in acute respiratory  distress HEENT: [place]/AT, MM pink/moist, PERRL,  Neuro: Alert and able to communicate but sentences are fragmented  CV: s1s2 regular rate and rhythm, no murmur, rubs, or gallops,  PULM:  Very diminished bilaterally despite BIPPS in place. There is accessory muscle use seen with signs of impending respiratory fatigue  GI: soft, bowel sounds active in all 4 quadrants, non-tender, non-distended Extremities: warm/dry, mild bilateral lower extremity edema. Severely swollen right upper extremity tracking from finger to shoulder, there is also scattered bruising throughout right upper extremity  Skin: no rashes or lesions  Resolved Hospital Problem list     Assessment & Plan:  Acute on chronic respiratory failure 2/2 HCAP/ COPD exacerbation/ CHF Home oxygen use 5 L, 24/7 P: Transfer to ICU now for close monitoring Patient will need to be intubated for airway protection  PMT consulted and contacting family to update  Continue ventilator support with lung protective strategies  Wean PEEP and FiO2 for sats greater than 90%. Head of bed elevated 30 degrees. Plateau pressures less than 30 cm H20.  Follow intermittent chest x-ray and ABG.   Ensure adequate pulmonary hygiene  VAP bundle in place  PAD protocol Obtain respiratory culture once intubated   Broaden ABTs  Continue steroids  Continue BDs   OSA on CPAP -Home Setting is fixed pressure 18 cm H2O -Poor compliance with CPAP at home , uses less than 4 hours and about 1 night per 3 months ( Per [place] Dr. Dr. Alcide Clever,  [facility].) P: Encourage nocturnal CPAP with set pressur eof 18 Goal to wear > 4hrs per night  Can also utilize BIPAP PRN for increased somnolence and AMS  Pulmonary Nodule per CXR Former Lung cancer  Former smoker with 45 pack year smoking Hx P: Will need to follow up in the outpatient setting Repeat CT as outpatient   Chest pain with elevated troponin POA -Likely related to demand  ischemia in the setting of PNA and COPD exacerbate  -Not a candidate for cath  Hx of CHF -EF 65-70% with grade I diastolic dysfunction  P: Cardiology following, applicate assistance  Continue ASA and statin  Continuous telemetry  Strict intake and output  Paroxysmal A-fib  -Home medications include cardizem and eliquis  P: Continuous telemetry  Continue cardizem per tube once intubated, if hemodynamics change can switch to Amio   Likely need to stop IV heparin drip and consider sending HIT panel, will discuss with attending   Acute Kidney Injury on CKD stage 3a -Worsened creatine flet secondary to overdiuresing  P:  May need to consider Nephrology consult given worsened creatinine and hyperkalemia  Follow renal function  Monitor urine output Trend Bmet Avoid nephrotoxins Ensure adequate renal perfusion   Right upper extremity swelling  -RUE is extremity tight with patient reported paresthesia  P: Obtain upper extremity venous doppler Q4 neurovascular check     Hyperkalemia P: Dose Lokelma now once OG tube in   Thrombocytopenia  -Plt 68 with steady downtrend  P: Trend CBC  Best Practice    Diet/type: NPO w/ oral meds DVT prophylaxis: systemic heparin GI prophylaxis: N/A Lines: N/A Foley:  N/A Code Status:  full code Last date of multidisciplinary goals of care discussion Pending  Critical care time:    Performed by: Mikhaila Roh D. Harris  Total critical care time: 50 minutes  Critical care time was exclusive of separately billable procedures and treating other patients.  Critical care was necessary to treat or prevent imminent or life-threatening deterioration.  Critical care was time spent personally by me on the following activities: development of treatment plan with patient and/or surrogate as well as nursing, discussions with consultants, evaluation of patient's response to treatment, examination of patient, obtaining history from patient or surrogate,  ordering and performing treatments and interventions, ordering and review of laboratory studies, ordering and review of radiographic studies, pulse oximetry and re-evaluation of patient's condition.  Kyrstin Campillo D. Kenton Kingfisher, NP-C [facility] Personal contact information can be found on Amion  09/09/2020, 11:01 AM

## 2020-08-17 NOTE — Progress Notes (Addendum)
ANTICOAGULATION CONSULT NOTE - Follow Up Consult  Pharmacy Consult for IV Heparin  Indication: chest pain/ACS, atrial fibrillation  No Known Allergies  Patient Measurements: Height: 5\' 9"  (175.3 cm) Weight: 65.8 kg (145 lb 1 oz) IBW/kg (Calculated) : 70.7 Heparin Dosing Weight: 65.8 kg  Vital Signs: Temp: 97.6 F (36.4 C) (07/27 0736) Temp Source: Axillary (07/27 0736) BP: 132/82 (07/27 0736) Pulse Rate: 86 (07/27 0807)  Labs: Recent Labs    08/06/2020 1507 08/14/2020 1623 08/03/2020 1632 07/30/2020 1632 07/27/2020 1850 08/07/2020 2024 07/29/2020 2308 08/11/20 0053 08/11/20 0407 08/11/20 0700 08/11/20 1951 Aug 14, 2020 0540  HGB  --  14.3 15.3  --   --   --   --   --  13.5  --   --  11.5*  HCT  --  47.1 45.0  --   --   --   --   --  45.3  --   --  37.4*  PLT  --  89*  --   --   --   --   --   --  76*  --   --  68*  APTT  --   --   --    < >  --   --  29  --   --  115* 118* 83*  LABPROT  --   --   --   --   --   --  15.6*  --   --   --   --   --   INR  --   --   --   --   --   --  1.2  --   --   --   --   --   HEPARINUNFRC  --   --   --    < >  --   --  >1.10*  --   --  >1.10* >1.10* >1.10*  CREATININE 1.72*  --   --   --   --  1.73*  --   --  1.73*  --   --   --   CKTOTAL  --   --   --   --   --   --   --  79  --   --   --   --   TROPONINIHS 134* 169*  --   --  156*  --   --  130*  --   --   --   --    < > = values in this interval not displayed.    Estimated Creatinine Clearance: 35.9 mL/min (A) (by C-G formula based on SCr of 1.73 mg/dL (H)).  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 following lung biopsy   Radiation 4/21,4/23,4/27,4/29,5/1   Right upper lobe of lung    Assessment: 75 yr old  man presented with chest pain and shortness of breath. Pharmacy was consulted to dose IV heparin for potential ACS. Per cardiology, increase in troponin without increase in CPK was likely a result of demand ischemia related to COPD exacerbation. Patient was on Eliquis 5mg  BID PTA for hx DVT/PE with atrial fibrillation.  Hgb decreased from 13.5>11.5 (7/26>7/27). PLT have been slowly decreasing, now at PLT 68. Spoke to nurse, she has not noted any signs of bleeding other than a bruise on the patient's arm which was present PTA.   Earlier today (7/27) pharmacy consulted for switch from IV heparin to eliquis, per cards. Pt is now NPO and being moved to ICU for increased WOB and likely intubation. We will continue with IV heparin. Heparin level falsely elevated at >1.1, aPTT ftherapeutic at 83s due to PTA apixaban. Continue to titrate heparin based off of aPTT until levels correlate.   Goal of Therapy:  Monitor platelets by anticoagulation protocol: Yes Goal aPTT 66-102s  Heparin level 0.3-0.7    Plan:  Continue haprain infusion at 600 units/h Check aPTT 8h  Monitor daily CBC, PLT, aPTT   Monitor for bleeding  Adria Dill, PharmD PGY-1 Acute Care Resident  09-11-2020 8:37 AM

## 2020-08-17 NOTE — Progress Notes (Signed)
Patient had worsening respiratory status this am PCCM team saw him at bedside and being transferred to ICU, spoke w/ PCCM team.TRH to sign off.

## 2020-08-17 NOTE — Procedures (Signed)
Arterial Catheter Insertion Procedure Note  Andrew Hodges  [id number]  30-Apr-1945  Date:2020/08/26  Time:1:19 PM    Provider Performing: Candee Furbish    Procedure: Insertion of Arterial Line [phone]) without US guidance  Indication(s) Blood pressure monitoring and/or need for frequent ABGs  Consent Unable to obtain consent due to emergent nature of procedure.  Anesthesia None   Time Out Verified patient identification, verified procedure, site/side was marked, verified correct patient position, special equipment/implants available, medications/allergies/relevant history reviewed, required imaging and test results available.   Sterile Technique Maximal sterile technique was not able to be achieved due to emergent nature of procedure.   Procedure Description Area of catheter insertion was cleaned with chlorhexidine and draped in sterile fashion. Without real-time ultrasound guidance an arterial catheter was placed into the right femoral artery.  Appropriate arterial tracings confirmed on monitor.     Complications/Tolerance None; patient tolerated the procedure well.   EBL Minimal   Specimen(s) None

## 2020-08-17 NOTE — Procedures (Signed)
Central Venous Catheter Insertion Procedure Note  Andrew Hodges  [id number]  05/20/1945  Date:09-04-2020  Time:6:36 PM   Provider Performing:Chasitee Zenker Rodman Pickle   Procedure: Insertion of Non-tunneled Central Venous Catheter(36556) with US guidance (11031)   Indication(s) Medication administration and Difficult access  Consent Unable to obtain consent due to emergent nature of procedure.  Anesthesia None  Timeout Verified patient identification, verified procedure, site/side was marked, verified correct patient position, special equipment/implants available, medications/allergies/relevant history reviewed, required imaging and test results available.  Sterile Technique Maximal sterile technique including full sterile barrier drape, hand hygiene, sterile gown, sterile gloves, mask, hair covering, sterile ultrasound probe cover (if used).  Procedure Description Area of catheter insertion was cleaned with chlorhexidine and draped in sterile fashion.  With real-time ultrasound guidance a central venous catheter was placed into the left internal jugular vein. Nonpulsatile blood flow however unable to pass wire after multiple attempts. Second provider Noe Gens, NP) also attempted and despite nonpulsatile blood flow unable to pass wire. Ultrasound demonstrated possible venous occlusion proximal to IJ. Femoral line was pursued after this procedure.  Complications/Tolerance Critically ill Chest X-ray is ordered to verify placement for internal jugular or subclavian cannulation.   Chest x-ray is not ordered for femoral cannulation.  EBL Minimal  Specimen(s) None

## 2020-08-17 NOTE — Progress Notes (Addendum)
Progress Note  Patient Name: Lionel December Date of Encounter: 09/09/20  [facility] Cardiologist: None   Subjective   SOB much improved and now off BiPAP and  on O2 at 4L with O2 sats 98%. NO further CP. He says that he has been having pleuritic CP for about a week which is sharp and worse with deep breathing.  Cxray shows PNA  Inpatient Medications    Scheduled Meds:  aspirin EC  81 mg Oral Daily   atorvastatin  20 mg Oral Daily   budesonide (PULMICORT) nebulizer solution  0.25 mg Nebulization BID   diltiazem  180 mg Oral Daily   guaiFENesin  10 mL Oral Q12H   ipratropium-albuterol  3 mL Nebulization Q6H   methylPREDNISolone (SOLU-MEDROL) injection  40 mg Intravenous Q12H   sodium chloride flush  3 mL Intravenous Q12H   sodium zirconium cyclosilicate  10 g Oral Daily   sodium zirconium cyclosilicate  10 g Oral Once   valACYclovir  500 mg Oral BID   Continuous Infusions:  sodium chloride     azithromycin 500 mg (08/11/20 2039)   cefTRIAXone (ROCEPHIN)  IV 1 g (08/11/20 2039)   heparin 600 Units/hr (08/11/20 2228)   PRN Meds: sodium chloride, acetaminophen **OR** acetaminophen, albuterol, ALPRAZolam, oxyCODONE, polyvinyl alcohol, sodium chloride flush   Vital Signs    Vitals:   09-09-2020 0206 09-09-2020 0214 09-09-20 0736 Sep 09, 2020 0807  BP: 137/80 127/78 132/82   Pulse: 83 76 97 86  Resp: 20 16 (!) 22 (!) 23  Temp:  97.6 F (36.4 C) 97.6 F (36.4 C)   TempSrc:  Axillary Axillary   SpO2: 95% 94% 98% 98%  Weight:      Height:        Intake/Output Summary (Last 24 hours) at September 09, 2020 0819 Last data filed at 09/09/20 0500 Gross per 24 hour  Intake 865.69 ml  Output 500 ml  Net 365.69 ml   Last 3 Weights 08/11/2020 08/07/2020 07/05/2013  Weight (lbs) 145 lb 1 oz 145 lb 145 lb 12.8 oz  Weight (kg) 65.8 kg 65.772 kg 66.134 kg      Telemetry    NSR - Personally Reviewed  ECG    No new EKG to review - Personally Reviewed  Physical Exam   GEN:  thin, cachectic and ill appearing Neck: No JVD Cardiac: RRR, no murmurs, rubs, or gallops.  Respiratory: decreased BS at bases GI: Soft, nontender, non-distended  MS: No edema; No deformity. Neuro:  Nonfocal  Psych: Normal affect   Labs    High Sensitivity Troponin:   Recent Labs  Lab 08/13/2020 1507 07/18/2020 1623 08/13/2020 1850 08/11/20 0053  TROPONINIHS 134* 169* 156* 130*      Chemistry Recent Labs  Lab 08/09/2020 1507 07/21/2020 1632 08/01/2020 2024 08/11/20 0407  NA 148* 146* 150* 148*  K 5.7* 5.4* 5.6* 5.3*  CL 106  --  104 102  CO2 33*  --  37* 39*  GLUCOSE 124*  --  97 101*  BUN 73*  --  70* 68*  CREATININE 1.72*  --  1.73* 1.73*  CALCIUM 9.2  --  8.9 9.0  PROT 5.7*  --   --  5.4*  ALBUMIN 3.1*  --   --  2.8*  AST 29  --   --  28  ALT 33  --   --  30  ALKPHOS 45  --   --  42  BILITOT 0.6  --   --  0.6  GFRNONAA 42*  --  41* 41*  ANIONGAP 9  --  9 7     Hematology Recent Labs  Lab 07/26/2020 1623 07/30/2020 1632 08/11/20 0407 2020-08-30 0540  WBC 13.6*  --  10.5 15.8*  RBC 4.97  --  4.78 4.10*  HGB 14.3 15.3 13.5 11.5*  HCT 47.1 45.0 45.3 37.4*  MCV 94.8  --  94.8 91.2  MCH 28.8  --  28.2 28.0  MCHC 30.4  --  29.8* 30.7  RDW 15.7*  --  15.6* 15.4  PLT 89*  --  76* 68*    BNP Recent Labs  Lab 08/09/2020 1623  BNP 64.4     DDimer No results for input(s): DDIMER in the last 168 hours.   Radiology    DG Chest 2 View  Result Date: 07/25/2020 CLINICAL DATA:  75 year old male with chest pain and shortness of breath. Cough. EXAM: CHEST - 2 VIEW COMPARISON:  None FINDINGS: Patchy area airspace density at the right lung base may represent atelectasis or infiltrate. Clinical correlation and follow-up to resolution recommended. Trace right pleural effusion may be present. A 14 mm nodular density in the right suprahilar region may represent an area of scarring or confluence of vasculature. Direct comparison with prior images, if available, recommended. If no  prior images are available attention on close follow-up imaging or further evaluation with chest CT is recommended. The left lung is clear. No pneumothorax. Top-normal cardiac size. Atherosclerotic calcification of the aorta. No acute osseous pathology. IMPRESSION: 1. Right lung base atelectasis or infiltrate. Clinical correlation and follow-up to resolution recommended. 2. A 14 mm nodular density in the right suprahilar region. Direct comparison with prior or follow-up imaging studies or further evaluation with chest CT on a nonemergent/outpatient basis recommended. Electronically Signed   By: Anner Crete M.D.   On: 08/06/2020 15:20   DG CHEST PORT 1 VIEW  Result Date: 08/11/2020 CLINICAL DATA:  Cough, shortness of breath EXAM: PORTABLE CHEST 1 VIEW COMPARISON:  08/11/2020 FINDINGS: Heart is normal size. Airspace disease in the right lower lobe is concerning for pneumonia. Left lung clear. No effusions. No acute bony abnormality. IMPRESSION: Right lower lobe airspace opacity concerning for pneumonia. Electronically Signed   By: Rolm Baptise M.D.   On: 08/11/2020 16:26   DG CHEST PORT 1 VIEW  Result Date: 08/11/2020 CLINICAL DATA:  Abnormal troponin levels. EXAM: PORTABLE CHEST 1 VIEW COMPARISON:  August 10, 2020 FINDINGS: Mild, stable atelectasis and/or infiltrate is seen within the right lung base. A stable 14 mm nodular opacity is seen within the suprahilar region on the right. Ill-defined surgical sutures are seen overlying the right upper lobe. There is no evidence of a pleural effusion or pneumothorax. A prominent curvilinear skin fold is suspected along the upper right lung. The heart size and mediastinal contours are within normal limits. There is mild calcification of the aortic arch. Degenerative changes seen throughout the thoracic spine. IMPRESSION: 1. Mild, stable right basilar atelectasis and/or infiltrate. 2. 14 mm right suprahilar nodular opacity. Further evaluation with nonemergent chest  CT is recommended to exclude the presence of an underlying neoplastic process. 3. Prominent skin fold overlying the upper right lung. Repeat chest plain film with improved patient positioning is recommended to further exclude the presence of an underlying pneumothorax. Electronically Signed   By: Virgina Norfolk M.D.   On: 08/11/2020 03:15   ECHOCARDIOGRAM COMPLETE  Result Date: 08/11/2020    ECHOCARDIOGRAM REPORT   Patient Name:  Nicholaus A MCRAE JR Date of Exam: 08/11/2020 Medical Rec #:  [id number]         Height:       69.0 in Accession #:    [id number]        Weight:       145.0 lb Date of Birth:  [birth year]         BSA:          1.802 m Patient Age:    66 years          BP:           165/82 mmHg Patient Gender: M                 HR:           91 bpm. Exam Location:  Inpatient Procedure: 2D Echo, 3D Echo, Cardiac Doppler and Color Doppler Indications:    R07.9* Chest pain, unspecified. Elevated troponin  History:        Patient has no prior history of Echocardiogram examinations.                 CAD, COPD, Signs/Symptoms:Shortness of Breath, Dyspnea and Chest                 Pain; Risk Factors:Hypertension and Dyslipidemia. Pneumonia.  Sonographer:    Roseanna Rainbow RDCS Referring Phys: [place]  Sonographer Comments: Technically difficult study due to poor echo windows. Image acquisition challenging due to COPD. Patient in pain, in fowler's position. IMPRESSIONS  1. Left ventricular ejection fraction, by estimation, is 65 to 70%. The left ventricle has normal function. The left ventricle has no regional wall motion abnormalities. There is moderate concentric left ventricular hypertrophy. Left ventricular diastolic parameters are consistent with Grade I diastolic dysfunction (impaired relaxation).  2. Right ventricular systolic function is normal. The right ventricular size is normal. There is normal pulmonary artery systolic pressure.  3. The mitral valve is normal in structure. No evidence of  mitral valve regurgitation. No evidence of mitral stenosis.  4. The aortic valve is tricuspid. Aortic valve regurgitation is trivial. No aortic stenosis is present.  5. The inferior vena cava is normal in size with greater than 50% respiratory variability, suggesting right atrial pressure of 3 mmHg. FINDINGS  Left Ventricle: Left ventricular ejection fraction, by estimation, is 65 to 70%. The left ventricle has normal function. The left ventricle has no regional wall motion abnormalities. The left ventricular internal cavity size was normal in size. There is  moderate concentric left ventricular hypertrophy. Left ventricular diastolic parameters are consistent with Grade I diastolic dysfunction (impaired relaxation). Normal left ventricular filling pressure. Right Ventricle: The right ventricular size is normal. No increase in right ventricular wall thickness. Right ventricular systolic function is normal. There is normal pulmonary artery systolic pressure. The tricuspid regurgitant velocity is 2.13 m/s, and  with an assumed right atrial pressure of 3 mmHg, the estimated right ventricular systolic pressure is 22.0 mmHg. Left Atrium: Left atrial size was normal in size. Right Atrium: Right atrial size was normal in size. Pericardium: There is no evidence of pericardial effusion. Mitral Valve: The mitral valve is normal in structure. No evidence of mitral valve regurgitation. No evidence of mitral valve stenosis. Tricuspid Valve: The tricuspid valve is normal in structure. Tricuspid valve regurgitation is trivial. No evidence of tricuspid stenosis. Aortic Valve: The aortic valve is tricuspid. Aortic valve regurgitation is trivial. No aortic stenosis is present. Pulmonic Valve: The pulmonic valve was  normal in structure. Pulmonic valve regurgitation is not visualized. No evidence of pulmonic stenosis. Aorta: The aortic root is normal in size and structure. Venous: The inferior vena cava is normal in size with greater  than 50% respiratory variability, suggesting right atrial pressure of 3 mmHg. IAS/Shunts: No atrial level shunt detected by color flow Doppler.  LEFT VENTRICLE PLAX 2D LVIDd:         3.20 cm     Diastology LVIDs:         2.10 cm     LV e' medial:    9.79 cm/s LV PW:         1.60 cm     LV E/e' medial:  7.4 LV IVS:        1.30 cm     LV e' lateral:   10.60 cm/s LVOT diam:     2.20 cm     LV E/e' lateral: 6.9 LV SV:         81 LV SV Index:   45 LVOT Area:     3.80 cm  LV Volumes (MOD) LV vol d, MOD A2C: 45.7 ml LV vol d, MOD A4C: 37.4 ml LV vol s, MOD A2C: 13.9 ml LV vol s, MOD A4C: 15.0 ml LV SV MOD A2C:     31.8 ml LV SV MOD A4C:     37.4 ml LV SV MOD BP:      27.1 ml RIGHT VENTRICLE             IVC RV S prime:     23.20 cm/s  IVC diam: 1.70 cm TAPSE (M-mode): 2.5 cm LEFT ATRIUM             Index      RIGHT ATRIUM           Index LA diam:        3.10 cm 1.72 cm/m RA Area:     15.80 cm LA Vol (A2C):   12.9 ml 7.16 ml/m RA Volume:   42.30 ml  23.47 ml/m LA Vol (A4C):   7.6 ml  4.23 ml/m LA Biplane Vol: 10.4 ml 5.77 ml/m  AORTIC VALVE LVOT Vmax:   135.00 cm/s LVOT Vmean:  88.900 cm/s LVOT VTI:    0.214 m  AORTA Ao Root diam: 3.50 cm Ao Asc diam:  3.20 cm MITRAL VALVE                TRICUSPID VALVE MV Area (PHT): 3.27 cm     TR Peak grad:   18.1 mmHg MV Decel Time: 232 msec     TR Vmax:        213.00 cm/s MV E velocity: 72.70 cm/s MV A velocity: 104.00 cm/s  SHUNTS MV E/A ratio:  0.70         Systemic VTI:  0.21 m                             Systemic Diam: 2.20 cm Skeet Latch MD Electronically signed by Skeet Latch MD Signature Date/Time: 08/11/2020/11:49:12 AM    Final     Cardiac Studies   2D echo 08/11/2020 IMPRESSIONS    1. Left ventricular ejection fraction, by estimation, is 65 to 70%. The  left ventricle has normal function. The left ventricle has no regional  wall motion abnormalities. There is moderate concentric left ventricular  hypertrophy. Left ventricular  diastolic parameters  are consistent with Grade I  diastolic dysfunction  (impaired relaxation).   2. Right ventricular systolic function is normal. The right ventricular  size is normal. There is normal pulmonary artery systolic pressure.   3. The mitral valve is normal in structure. No evidence of mitral valve  regurgitation. No evidence of mitral stenosis.   4. The aortic valve is tricuspid. Aortic valve regurgitation is trivial.  No aortic stenosis is present.   5. The inferior vena cava is normal in size with greater than 50%  respiratory variability, suggesting right atrial pressure of 3 mmHg.   Patient Profile     75 y.o. male with a hx of lung CA rx w/ XRT, colon CA s/p colectomy, HTN, HLD, DVT, IDA anemia, COPD on O2 at 5 lpm, Coronary atherosclerosis on CT, OSA on CPAP, who is being seen 08/11/2020 for the evaluation of elevated troponin at the request of Dr Lupita Leash.  Assessment & Plan    Chest pain, elevated troponin - suspect CP is related to acute COPD exacerbation/PNA - his CP is pleuritic and worse with deep breathing for the past week - hs Trop minimally elevated with flat trend [phone]) and likely related to demand ischemia in the setting of COPD exacerbation, PNA and acute hypoxemic respiratory failure - 2D echo this admit with normal LVF EF 65-70% with no RWMAs - His respiratory illness is severe enough that he is not a candidate for any ischemic evaluation at this time - Continue aspirin and statin, no BB 2nd lung dz - He does have hx of coronary Ca on Chest CT but given his AKi on CKD and co morbidities, he is not a candidate for cardiac cath at this time.   - He needs to get over his acute respiratory illness and then can consider outpt Nuclear stress test.    2.  Hypoxemic Respiratory Failure/COPD exacerbation/PNA - He has been placed on nebulizers and steroids per IM - Now off BiPAP and on 4L [place] O2 with O2 sats 98% - treatment with nebs and antibx per CCM  3.  AKI on CKD stage  3a -worsening SCr likely related to overdiuresis -his LVF is normal, BNP normal and RAP normal on echo with no evidence of elevated LVEDP on tissue doppler.  Cxray c/w PNA and no edema -he does have swelling in RUE but also bruising and likely related to IV and blood draws with hematoma>>perTRH -diuretics stopped yesterday and BMET pending this am -he has mild ankle edema likely related to 3rd spacing from low albumin -recommend TED hose  4.  PAF -he is maintaining NSR on tele -continue Cardizem CD 180mg  daily for BP control and suppression of PAF -Currently on IV Heparin gtt  -restart Eliqius 5mg  BID when ok with CCM  5.  HTN -continue Cardizem CD 180mg  daily  I have spent a total of 35 minutes with patient reviewing 2D echo , telemetry, EKGs, labs and examining patient as well as establishing an assessment and plan that was discussed with the patient.  > 50% of time was spent in direct patient care.      [facility] will sign off.   Medication Recommendations:  Cardizem CD 180mg  daily, Eliquis 5mg  BID Other recommendations (labs, testing, etc):  Lexiscan myoview once discharged>>Please call us prior to discharge to set up stress test outpt Follow up as an outpatient:  2 weeks after Leane Call  For questions or updates, please contact [facility] Please consult [email or web address] for contact info under  Signed, Fransico Him, MD  2020-08-18, 8:19 AM

## 2020-08-17 NOTE — Progress Notes (Signed)
   17-Aug-2020 1211  Clinical Encounter Type  Visited With Family  Visit Type Code  Referral From Nurse  Consult/Referral To Chaplain   Chaplain responded. I accompany doctor to consult room as she informed Ms. Suzan Slick of the patient's death. I provided comfort, emotional and grief support, shared words of hope and inspiration and led prayer of comfort, peace and clarity. Ms. Kennyth Lose declined to visit. The nurse gave her the patient's personal belongings. I assisted Ms. Kennyth Lose in contacting the patient's cousin [place] and unnamed estranged sister. I also provide a Patient Placement card. She said, [facility] [address] [place], [place]. I escorted Ms. Kennyth Lose to the ED exit. This note was prepared by Jeanine Luz, M.Div..  For questions please contact by phone [phone].

## 2020-08-17 NOTE — Progress Notes (Signed)
OT Cancellation Note  Patient Details Name: Andrew Hodges MRN: [id number] DOB: 05/08/1945   Cancelled Treatment:    Reason Eval/Treat Not Completed: Medical issues which prohibited therapy.  Pt currently in respiratory distress and on BiPAP.  Spoke with NP - will hold  Nilsa Nutting., OTR/L Acute Rehabilitation Services Pager [phone] Office [phone]   Lucille Passy M 08-18-2020, 10:16 AM

## 2020-08-17 NOTE — Progress Notes (Signed)
Patient transported from 5W to 2M06 on BiPAP without complication.

## 2020-08-17 NOTE — Procedures (Signed)
Intubation Procedure Note  Andrew Hodges  [id number]  06/23/1945  Date:10-Sep-2020  Time:6:12 PM   Provider Performing:Lesha Jager Rodman Pickle    Procedure: Intubation (48350)  Indication(s) Respiratory Failure  Consent Unable to obtain consent due to emergent nature of procedure.   Anesthesia Etomidate, Versed, and Fentanyl   Time Out Verified patient identification, verified procedure, site/side was marked, verified correct patient position, special equipment/implants available, medications/allergies/relevant history reviewed, required imaging and test results available.   Sterile Technique Usual hand hygeine, masks, and gloves were used   Procedure Description Patient positioned in bed supine.  Sedation given as noted above.  Patient was intubated with endotracheal tube using Glidescope.  View was Grade 1 full glottis .  Number of attempts was 1.  Colorimetric CO2 detector was consistent with tracheal placement.   Complications/Tolerance None; patient tolerated the procedure well. Chest X-ray is ordered to verify placement.   EBL Minimal   Specimen(s) None

## 2020-08-17 NOTE — Progress Notes (Signed)
Pharmacy Antibiotic Note  Andrew Hodges is a 75 y.o. male admitted on 08/09/2020 with aspiration pneumonia.  Pharmacy has been consulted for Zosyn dosing. WBC up 15.8, SCr up 1.96. Intubation on transfer to ICU. MRSA PCR sent but holding on Vancomycin for now after discussion with CCM.   Plan: Zosyn 3.375g IV q8h (4 hour infusion). Monitor renal function, culture results, and clinical status.  Height: 5\' 9"  (175.3 cm) Weight: 65.8 kg (145 lb 1 oz) IBW/kg (Calculated) : 70.7  Temp (24hrs), Avg:98 F (36.7 C), Min:97.6 F (36.4 C), Max:98.8 F (37.1 C)  Recent Labs  Lab 08/09/20 1900 07/31/2020 1507 07/26/2020 1623 08/13/2020 1636 07/29/2020 1850 07/28/2020 2024 08/11/20 0407 08/15/20 0540 08-15-2020 0552  WBC 15.3*  14.8*  --  13.6*  --   --   --  10.5 15.8*  --   CREATININE  --  1.72*  --   --   --  1.73* 1.73*  --  1.96*  LATICACIDVEN  --   --   --  2.1* 1.3  --   --   --   --     Estimated Creatinine Clearance: 31.7 mL/min (A) (by C-G formula based on SCr of 1.96 mg/dL (H)).    No Known Allergies  Antimicrobials this admission: CTX 7/25 >7/27 Azith 7/25 >7/27 Valtrex 7/25>  Zosyn 7/27 >>  Dose adjustments this admission:   Microbiology results: 7/27  Sputum cx: sent 7/27 MRSA PCR: sent 7/25 Bcx: ngtd 7/25 COVID and flu: neg    Thank you for allowing pharmacy to be a part of this patient's care.  Sloan Leiter, PharmD, BCPS, BCCCP Clinical Pharmacist Please refer to [facility] for [place] numbers 2020-08-15 11:22 AM

## 2020-08-17 NOTE — Death Summary Note (Addendum)
DEATH SUMMARY   Patient Details  Name: KAYLA WEEKES MRN: [id number] DOB: [birth year]  Admission/Discharge Information   Admit Date:  September 09, 2020  Date of Death: Date of Death: 09/11/20  Time of Death: Time of Death: 1219-04-26  Length of Stay: 2  Referring Physician: Pcp, No   Reason(s) for Hospitalization  Pneumonia   Diagnoses  Preliminary cause of death:  Secondary Diagnoses (including complications and co-morbidities):  Active Problems:   HCAP (healthcare-associated pneumonia)   Elevated troponin   Debility   COPD with acute exacerbation (HCC)   CAD (coronary artery disease)   Essential hypertension   Hyperkalemia   Hyperlipidemia   AKI (acute kidney injury) ([place])   Cardiac arrest, cause unspecified (HCC)   Acute respiratory failure with hypoxia and hypercapnia (HCC)   CKD (chronic kidney disease), stage III (HCC) Chronic diastolic heart failure Brief Hospital Course (including significant findings, care, treatment, and services provided and events leading to death)  Malon A TAYLOR LEVICK is a 75 y.o. year old male who presented to the ED from [facility] on 08/11/2020 for shortness of breath and intermittent chest pain x1 week with increasing oxygen requirement from baseline of 5 L, increased WOB and elevated troponin.   In the ED, chest x-ray showed right lung base atelectasis versus infiltrate, 14 mm nodular density, mild leukocytosis lactic acidosis. Troponin 134, 169 156 130 venous gas 7.37/69.8/45.  Patient was acutely short of breath hypoxic and was needing 5 to 8L nasal cannula and subsequently BiPAP.  Acute on chronic respiratory failure 2/2 HCAP/ COPD exacerbation/ CHF Home oxygen use 5 L, 24/7 He was initially able to be weaned to 4 L nasal cannula by 6 AM 07/26, but required Bi-PAP by 6 PM that evening. He continued to have worsening work of breathing and poor oxygenation status and the decision was made to transfer him to the ICU and intubate.  While  preparing for intubation the patient's heart rate began to drop and he became apneic. Patient was then successfully intubated, but vital signs remained unstable. Decision was made to place IJ central line at that time, but placement was not successful. A femoral central arterial and venous line was then successfully placed. Shortly after placement of femoral line the patient coded and compressions were initiated. No pulse was able to be obtained and time of death was declared at 12:21.   Pertinent Labs and Studies  Significant Diagnostic Studies DG Chest 2 View  Result Date: Sep 09, 2020 CLINICAL DATA:  75 year old male with chest pain and shortness of breath. Cough. EXAM: CHEST - 2 VIEW COMPARISON:  None FINDINGS: Patchy area airspace density at the right lung base may represent atelectasis or infiltrate. Clinical correlation and follow-up to resolution recommended. Trace right pleural effusion may be present. A 14 mm nodular density in the right suprahilar region may represent an area of scarring or confluence of vasculature. Direct comparison with prior images, if available, recommended. If no prior images are available attention on close follow-up imaging or further evaluation with chest CT is recommended. The left lung is clear. No pneumothorax. Top-normal cardiac size. Atherosclerotic calcification of the aorta. No acute osseous pathology. IMPRESSION: 1. Right lung base atelectasis or infiltrate. Clinical correlation and follow-up to resolution recommended. 2. A 14 mm nodular density in the right suprahilar region. Direct comparison with prior or follow-up imaging studies or further evaluation with chest CT on a nonemergent/outpatient basis recommended. Electronically Signed   By: Anner Crete M.D.   On: 09-09-2020 15:20  DG CHEST PORT 1 VIEW  Result Date: 08-22-20 CLINICAL DATA:  Healthcare associated pneumonia EXAM: PORTABLE CHEST 1 VIEW COMPARISON:  Prior chest x-ray 08/11/2020 FINDINGS:  Patchy airspace opacity in medial right base on the frontal view. Suspect right lower lobe location given relatively well preserved right cardiac margin. The cardiac and mediastinal contours are normal. Atherosclerotic calcifications present in the transverse aorta. Stable appearance of spiculated right suprahilar nodule. No pleural effusion or pneumothorax. No new acute airspace abnormality. IMPRESSION: 1. Stable appearance of probable right lower lobe bronchopneumonia. 2. Spiculated right suprahilar nodular opacity also remains unchanged compared to the recent prior imaging. As previously noted, ultimately a CT scan of the chest would be recommended for further evaluation of this finding as it is concerning for possible underlying bronchogenic carcinoma. Electronically Signed   By: Jacqulynn Cadet M.D.   On: Aug 22, 2020 09:03   DG CHEST PORT 1 VIEW  Result Date: 08/11/2020 CLINICAL DATA:  Cough, shortness of breath EXAM: PORTABLE CHEST 1 VIEW COMPARISON:  08/11/2020 FINDINGS: Heart is normal size. Airspace disease in the right lower lobe is concerning for pneumonia. Left lung clear. No effusions. No acute bony abnormality. IMPRESSION: Right lower lobe airspace opacity concerning for pneumonia. Electronically Signed   By: Rolm Baptise M.D.   On: 08/11/2020 16:26   DG CHEST PORT 1 VIEW  Result Date: 08/11/2020 CLINICAL DATA:  Abnormal troponin levels. EXAM: PORTABLE CHEST 1 VIEW COMPARISON:  August 10, 2020 FINDINGS: Mild, stable atelectasis and/or infiltrate is seen within the right lung base. A stable 14 mm nodular opacity is seen within the suprahilar region on the right. Ill-defined surgical sutures are seen overlying the right upper lobe. There is no evidence of a pleural effusion or pneumothorax. A prominent curvilinear skin fold is suspected along the upper right lung. The heart size and mediastinal contours are within normal limits. There is mild calcification of the aortic arch. Degenerative changes  seen throughout the thoracic spine. IMPRESSION: 1. Mild, stable right basilar atelectasis and/or infiltrate. 2. 14 mm right suprahilar nodular opacity. Further evaluation with nonemergent chest CT is recommended to exclude the presence of an underlying neoplastic process. 3. Prominent skin fold overlying the upper right lung. Repeat chest plain film with improved patient positioning is recommended to further exclude the presence of an underlying pneumothorax. Electronically Signed   By: Virgina Norfolk M.D.   On: 08/11/2020 03:15   ECHOCARDIOGRAM COMPLETE  Result Date: 08/11/2020    ECHOCARDIOGRAM REPORT   Patient Name:   LJ MIYAMOTO JR Date of Exam: 08/11/2020 Medical Rec #:  [id number]         Height:       69.0 in Accession #:    [id number]        Weight:       145.0 lb Date of Birth:  01/04/1946         BSA:          1.802 m Patient Age:    68 years          BP:           165/82 mmHg Patient Gender: M                 HR:           91 bpm. Exam Location:  Inpatient Procedure: 2D Echo, 3D Echo, Cardiac Doppler and Color Doppler Indications:    R07.9* Chest pain, unspecified. Elevated troponin  History:  Patient has no prior history of Echocardiogram examinations.                 CAD, COPD, Signs/Symptoms:Shortness of Breath, Dyspnea and Chest                 Pain; Risk Factors:Hypertension and Dyslipidemia. Pneumonia.  Sonographer:    Roseanna Rainbow RDCS Referring Phys: [place]  Sonographer Comments: Technically difficult study due to poor echo windows. Image acquisition challenging due to COPD. Patient in pain, in fowler's position. IMPRESSIONS  1. Left ventricular ejection fraction, by estimation, is 65 to 70%. The left ventricle has normal function. The left ventricle has no regional wall motion abnormalities. There is moderate concentric left ventricular hypertrophy. Left ventricular diastolic parameters are consistent with Grade I diastolic dysfunction (impaired relaxation).  2. Right  ventricular systolic function is normal. The right ventricular size is normal. There is normal pulmonary artery systolic pressure.  3. The mitral valve is normal in structure. No evidence of mitral valve regurgitation. No evidence of mitral stenosis.  4. The aortic valve is tricuspid. Aortic valve regurgitation is trivial. No aortic stenosis is present.  5. The inferior vena cava is normal in size with greater than 50% respiratory variability, suggesting right atrial pressure of 3 mmHg. FINDINGS  Left Ventricle: Left ventricular ejection fraction, by estimation, is 65 to 70%. The left ventricle has normal function. The left ventricle has no regional wall motion abnormalities. The left ventricular internal cavity size was normal in size. There is  moderate concentric left ventricular hypertrophy. Left ventricular diastolic parameters are consistent with Grade I diastolic dysfunction (impaired relaxation). Normal left ventricular filling pressure. Right Ventricle: The right ventricular size is normal. No increase in right ventricular wall thickness. Right ventricular systolic function is normal. There is normal pulmonary artery systolic pressure. The tricuspid regurgitant velocity is 2.13 m/s, and  with an assumed right atrial pressure of 3 mmHg, the estimated right ventricular systolic pressure is 43.3 mmHg. Left Atrium: Left atrial size was normal in size. Right Atrium: Right atrial size was normal in size. Pericardium: There is no evidence of pericardial effusion. Mitral Valve: The mitral valve is normal in structure. No evidence of mitral valve regurgitation. No evidence of mitral valve stenosis. Tricuspid Valve: The tricuspid valve is normal in structure. Tricuspid valve regurgitation is trivial. No evidence of tricuspid stenosis. Aortic Valve: The aortic valve is tricuspid. Aortic valve regurgitation is trivial. No aortic stenosis is present. Pulmonic Valve: The pulmonic valve was normal in structure. Pulmonic  valve regurgitation is not visualized. No evidence of pulmonic stenosis. Aorta: The aortic root is normal in size and structure. Venous: The inferior vena cava is normal in size with greater than 50% respiratory variability, suggesting right atrial pressure of 3 mmHg. IAS/Shunts: No atrial level shunt detected by color flow Doppler.  LEFT VENTRICLE PLAX 2D LVIDd:         3.20 cm     Diastology LVIDs:         2.10 cm     LV e' medial:    9.79 cm/s LV PW:         1.60 cm     LV E/e' medial:  7.4 LV IVS:        1.30 cm     LV e' lateral:   10.60 cm/s LVOT diam:     2.20 cm     LV E/e' lateral: 6.9 LV SV:         81  LV SV Index:   45 LVOT Area:     3.80 cm  LV Volumes (MOD) LV vol d, MOD A2C: 45.7 ml LV vol d, MOD A4C: 37.4 ml LV vol s, MOD A2C: 13.9 ml LV vol s, MOD A4C: 15.0 ml LV SV MOD A2C:     31.8 ml LV SV MOD A4C:     37.4 ml LV SV MOD BP:      27.1 ml RIGHT VENTRICLE             IVC RV S prime:     23.20 cm/s  IVC diam: 1.70 cm TAPSE (M-mode): 2.5 cm LEFT ATRIUM             Index      RIGHT ATRIUM           Index LA diam:        3.10 cm 1.72 cm/m RA Area:     15.80 cm LA Vol (A2C):   12.9 ml 7.16 ml/m RA Volume:   42.30 ml  23.47 ml/m LA Vol (A4C):   7.6 ml  4.23 ml/m LA Biplane Vol: 10.4 ml 5.77 ml/m  AORTIC VALVE LVOT Vmax:   135.00 cm/s LVOT Vmean:  88.900 cm/s LVOT VTI:    0.214 m  AORTA Ao Root diam: 3.50 cm Ao Asc diam:  3.20 cm MITRAL VALVE                TRICUSPID VALVE MV Area (PHT): 3.27 cm     TR Peak grad:   18.1 mmHg MV Decel Time: 232 msec     TR Vmax:        213.00 cm/s MV E velocity: 72.70 cm/s MV A velocity: 104.00 cm/s  SHUNTS MV E/A ratio:  0.70         Systemic VTI:  0.21 m                             Systemic Diam: 2.20 cm Skeet Latch MD Electronically signed by Skeet Latch MD Signature Date/Time: 08/11/2020/11:49:12 AM    Final     Microbiology Recent Results (from the past 240 hour(s))  Blood culture (routine x 2)     Status: None (Preliminary result)   Collection  Time: 07/22/2020  4:23 PM   Specimen: BLOOD  Result Value Ref Range Status   Specimen Description BLOOD LEFT ANTECUBITAL  Final   Special Requests   Final    BOTTLES DRAWN AEROBIC AND ANAEROBIC Blood Culture results may not be optimal due to an inadequate volume of blood received in culture bottles   Culture   Final    NO GROWTH 2 DAYS Performed at [facility], [address] [place], [place] [address]    Report Status PENDING  Incomplete  Blood culture (routine x 2)     Status: None (Preliminary result)   Collection Time: 08/03/2020  4:23 PM   Specimen: BLOOD LEFT FOREARM  Result Value Ref Range Status   Specimen Description BLOOD LEFT FOREARM  Final   Special Requests   Final    BOTTLES DRAWN AEROBIC AND ANAEROBIC Blood Culture results may not be optimal due to an inadequate volume of blood received in culture bottles   Culture   Final    NO GROWTH 2 DAYS Performed at [facility], [place] [address], [place], [place] [address]    Report Status PENDING  Incomplete  Resp  Panel by RT-PCR (Flu A&B, Covid) Nasopharyngeal Swab     Status: None   Collection Time: 08/16/2020  4:25 PM   Specimen: Nasopharyngeal Swab; Nasopharyngeal(NP) swabs in vial transport medium  Result Value Ref Range Status   SARS Coronavirus 2 by RT PCR NEGATIVE NEGATIVE Final    Comment: (NOTE) SARS-CoV-2 target nucleic acids are NOT DETECTED.  The SARS-CoV-2 RNA is generally detectable in upper respiratory specimens during the acute phase of infection. The lowest concentration of SARS-CoV-2 viral copies this assay can detect is 138 copies/mL. A negative result does not preclude SARS-Cov-2 infection and should not be used as the sole basis for treatment or other patient management decisions. A negative result may occur with  improper specimen collection/handling, submission of specimen other than nasopharyngeal swab, presence of viral mutation(s) within the areas targeted by this assay, and inadequate  number of viral copies(<138 copies/mL). A negative result must be combined with clinical observations, patient history, and epidemiological information. The expected result is Negative.  Fact Sheet for Patients:  EntrepreneurPulse.com.au  Fact Sheet for Healthcare Providers:  IncredibleEmployment.be  This test is no t yet approved or cleared by the [place] FDA and  has been authorized for detection and/or diagnosis of SARS-CoV-2 by FDA under an Emergency Use Authorization (EUA). This EUA will remain  in effect (meaning this test can be used) for the duration of the COVID-19 declaration under Section 564(b)(1) of the Act, 21 U.S.C.section 360bbb-3(b)(1), unless the authorization is terminated  or revoked sooner.       Influenza A by PCR NEGATIVE NEGATIVE Final   Influenza B by PCR NEGATIVE NEGATIVE Final    Comment: (NOTE) The Xpert Xpress SARS-CoV-2/FLU/RSV plus assay is intended as an aid in the diagnosis of influenza from Nasopharyngeal swab specimens and should not be used as a sole basis for treatment. Nasal washings and aspirates are unacceptable for Xpert Xpress SARS-CoV-2/FLU/RSV testing.  Fact Sheet for Patients: EntrepreneurPulse.com.au  Fact Sheet for Healthcare Providers: IncredibleEmployment.be  This test is not yet approved or cleared by the [place] FDA and has been authorized for detection and/or diagnosis of SARS-CoV-2 by FDA under an Emergency Use Authorization (EUA). This EUA will remain in effect (meaning this test can be used) for the duration of the COVID-19 declaration under Section 564(b)(1) of the Act, 21 U.S.C. section 360bbb-3(b)(1), unless the authorization is terminated or revoked.  Performed at [facility], [place] [address], [place], [place] [address]     Lab Basic Metabolic Panel: Recent Labs  Lab 07/29/2020 1507 08/06/2020 1632 08/09/2020 2024  08/11/20 0407 08/11/20 1230 Aug 16, 2020 0540 08-16-2020 0552  NA 148* 146* 150* 148*  --   --  146*  K 5.7* 5.4* 5.6* 5.3*  --   --  5.7*  CL 106  --  104 102  --   --  102  CO2 33*  --  37* 39*  --   --  36*  GLUCOSE 124*  --  97 101*  --   --  104*  BUN 73*  --  70* 68*  --   --  84*  CREATININE 1.72*  --  1.73* 1.73*  --   --  1.96*  CALCIUM 9.2  --  8.9 9.0  --   --  8.5*  MG  --   --   --  2.2 2.0 2.2  --   PHOS  --   --   --  5.2*  --   --   --  Liver Function Tests: Recent Labs  Lab 08/01/2020 1507 08/11/20 0407  AST 29 28  ALT 33 30  ALKPHOS 45 42  BILITOT 0.6 0.6  PROT 5.7* 5.4*  ALBUMIN 3.1* 2.8*   No results for input(s): LIPASE, AMYLASE in the last 168 hours. No results for input(s): AMMONIA in the last 168 hours. CBC: Recent Labs  Lab 08/09/20 1900 08/03/2020 1623 07/21/2020 1632 08/11/20 0407 08-19-20 0540  WBC 15.3*  14.8* 13.6*  --  10.5 15.8*  NEUTROABS 14.2* 12.6*  --  10.1*  --   HGB 14.1  13.9 14.3 15.3 13.5 11.5*  HCT 44.8  44.3 47.1 45.0 45.3 37.4*  MCV 93.3  93.9 94.8  --  94.8 91.2  PLT 97*  98* 89*  --  76* 68*   Cardiac Enzymes: Recent Labs  Lab 08/11/20 0053  CKTOTAL 79   Sepsis Labs: Recent Labs  Lab 08/09/20 1900 07/26/2020 1623 08/11/2020 1636 07/17/2020 1850 08/11/20 0407 August 19, 2020 0540  PROCALCITON  --   --   --  0.14 0.16 0.19  WBC 15.3*  14.8* 13.6*  --   --  10.5 15.8*  LATICACIDVEN  --   --  2.1* 1.3  --   --     Procedures/Operations  Intubation and placement on mechanical ventilation.   Insertion of non-tunneled Central Venous Catheter (59935) with US guidance (70177) into internal jugular vein - NOT successful   Insertion of Non-tunneled Central Venous Catheter (93903) with US guidance (00923) into femoral vein.  Insertion of Arterial Line [phone]) without US guidance into femoral artery.    Krishna Heuer Rodman Pickle 08/19/20, 6:29 PM

## 2020-08-17 NOTE — Procedures (Signed)
Central Venous Catheter Insertion Procedure Note  Andrew Hodges  [id number]  Jul 20, 1945  Date:09/06/2020  Time:1:18 PM   Provider Performing:Mahalia Dykes Cipriano Mile   Procedure: Insertion of Non-tunneled Central Venous [phone]) with US guidance (53976)   Indication(s) Medication administration  Consent Emergent  Anesthesia None  Timeout Verified patient identification, verified procedure, site/side was marked, verified correct patient position, special equipment/implants available, medications/allergies/relevant history reviewed, required imaging and test results available.  Sterile Technique Maximal sterile technique including full sterile barrier drape, hand hygiene, sterile gown, sterile gloves, mask, hair covering, sterile ultrasound probe cover (if used).  Procedure Description Area of catheter insertion was cleaned with chlorhexidine and draped in sterile fashion.  With real-time ultrasound guidance a central venous catheter was placed into the right femoral vein. Nonpulsatile blood flow and easy flushing noted in all ports.  The catheter was sutured in place and sterile dressing applied.  Complications/Tolerance None; patient tolerated the procedure well. Chest X-ray is ordered to verify placement for internal jugular or subclavian cannulation.   Chest x-ray is not ordered for femoral cannulation.  EBL Minimal  Specimen(s) None

## 2020-08-17 DEATH — deceased

## 2020-08-18 LAB — POCT I-STAT 7, (LYTES, BLD GAS, ICA,H+H)
Acid-Base Excess: 10.1 mmol/L — ABNORMAL HIGH (ref 0.0–2.0)
Acid-Base Excess: 15 mmol/L — ABNORMAL HIGH (ref 0.0–2.0)
Bicarbonate: 39.8 mmol/L — ABNORMAL HIGH (ref 20.0–28.0)
Bicarbonate: 43.3 mmol/L — ABNORMAL HIGH (ref 20.0–28.0)
Calcium, Ion: 1.28 mmol/L (ref 1.15–1.40)
Calcium, Ion: 1.23 mmol/L (ref 1.15–1.40)
Comment: 300
Comment: 300
HCT: 41 % (ref 39.0–52.0)
HCT: 40 % (ref 39.0–52.0)
Hemoglobin: 13.9 g/dL (ref 13.0–17.0)
Hemoglobin: 13.6 g/dL (ref 13.0–17.0)
O2 Saturation: 89 %
O2 Saturation: 87 %
Patient temperature: 98.6
Potassium: 4.9 mmol/L (ref 3.5–5.1)
Potassium: 4.8 mmol/L (ref 3.5–5.1)
Sodium: 145 mmol/L (ref 135–145)
Sodium: 143 mmol/L (ref 135–145)
TCO2: 42 mmol/L — ABNORMAL HIGH (ref 22–32)
TCO2: 45 mmol/L — ABNORMAL HIGH (ref 22–32)
pCO2 arterial: 76.1 mmHg (ref 32.0–48.0)
pCO2 arterial: 70.2 mmHg (ref 32.0–48.0)
pH, Arterial: 7.326 — ABNORMAL LOW (ref 7.350–7.450)
pH, Arterial: 7.399 (ref 7.350–7.450)
pO2, Arterial: 64 mmHg — ABNORMAL LOW (ref 83.0–108.0)
pO2, Arterial: 56 mmHg — ABNORMAL LOW (ref 83.0–108.0)

## 2020-09-08 MED FILL — Medication: Qty: 1 | Status: AC

## 2022-01-17 IMAGING — DX DG CHEST 1V PORT
1 series · 1 of 1 positions shown · non-contrast
Comparison: Prior chest x-ray 08/11/2020

CLINICAL DATA: Healthcare associated pneumonia

EXAM:
PORTABLE CHEST 1 VIEW

[chest]
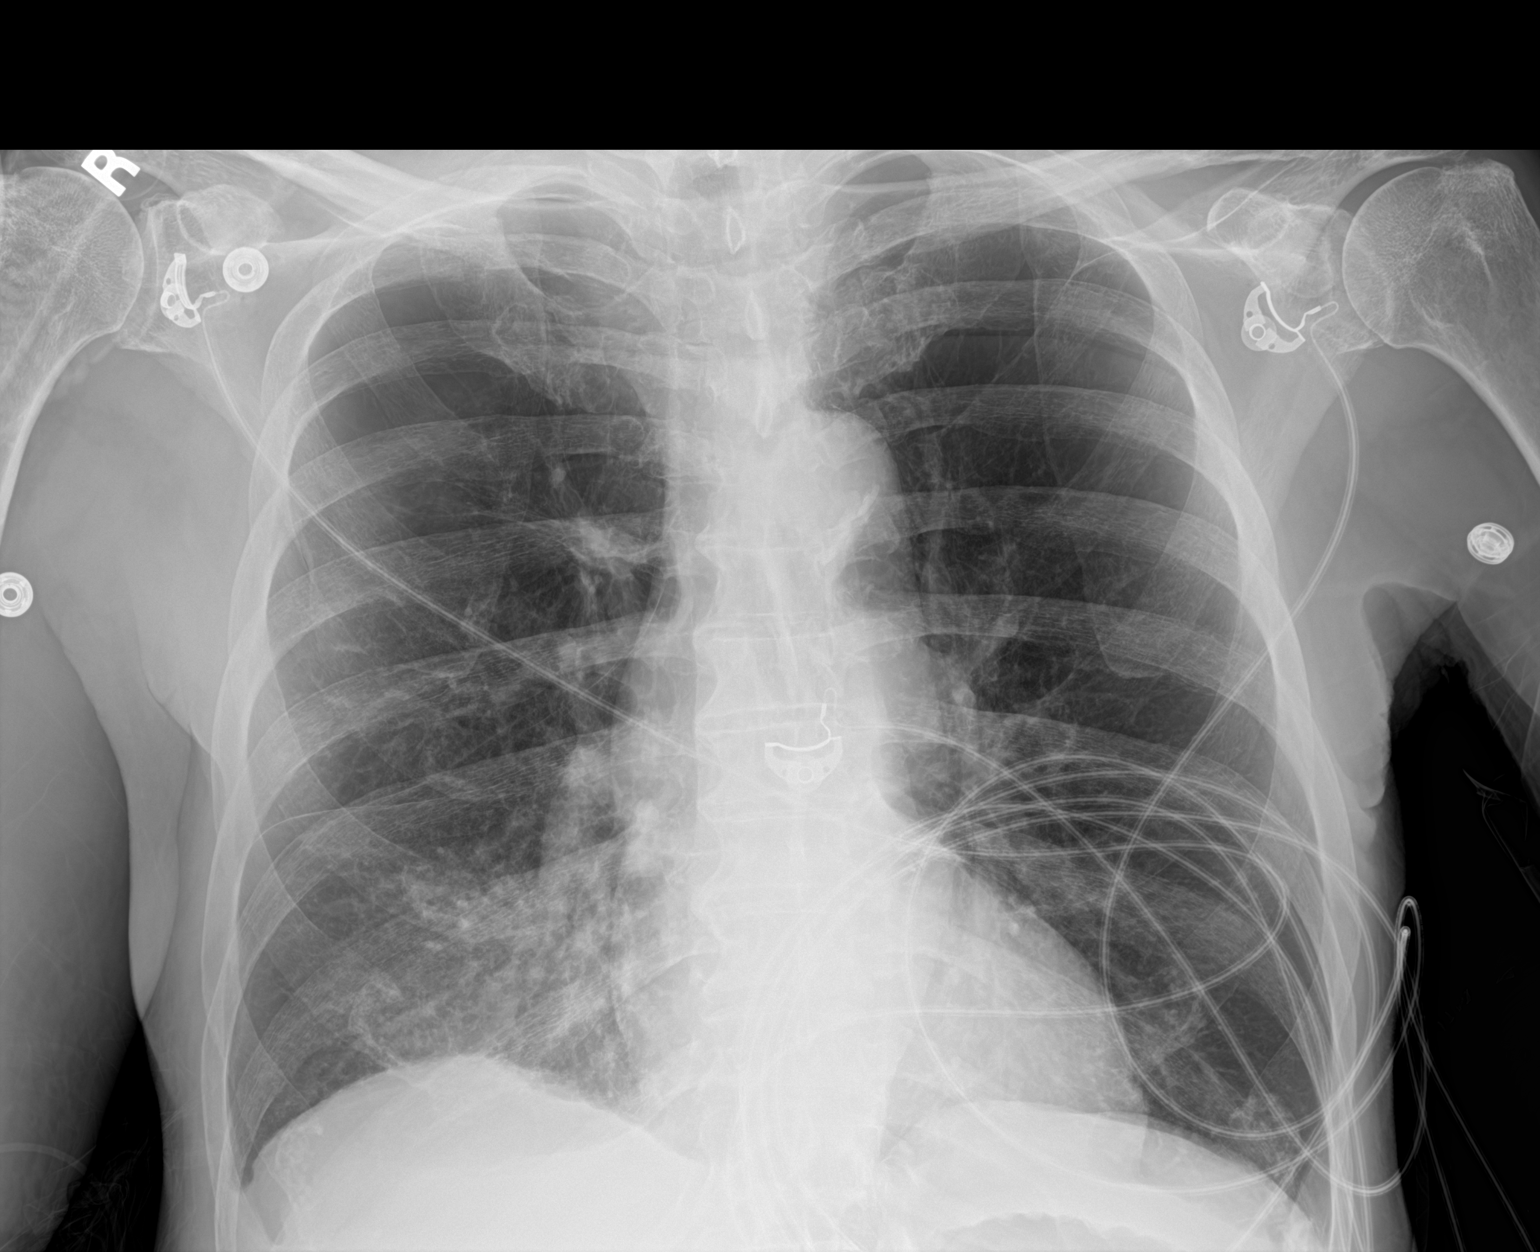

[1 of 1 positions shown; findings below may reference images not displayed]

FINDINGS: Patchy airspace opacity in medial right base on the frontal view.
Suspect right lower lobe location given relatively well preserved
right cardiac margin. The cardiac and mediastinal contours are
normal. Atherosclerotic calcifications present in the transverse
aorta. Stable appearance of spiculated right suprahilar nodule. No
pleural effusion or pneumothorax. No new acute airspace abnormality.
IMPRESSION: 1. Stable appearance of probable right lower lobe bronchopneumonia.
2. Spiculated right suprahilar nodular opacity also remains
unchanged compared to the recent prior imaging. As previously noted,
ultimately a CT scan of the chest would be recommended for further
evaluation of this finding as it is concerning for possible
underlying bronchogenic carcinoma.
